# Patient Record
Sex: Female | Born: 1975
Health system: Southern US, Community
[De-identification: ages and names within clinical notes are randomized; demographics above are authoritative.]

## PROBLEM LIST (undated history)

## (undated) DIAGNOSIS — S92919A Unspecified fracture of unspecified toe(s), initial encounter for closed fracture: Secondary | ICD-10-CM

## (undated) DIAGNOSIS — S4290XA Fracture of unspecified shoulder girdle, part unspecified, initial encounter for closed fracture: Secondary | ICD-10-CM

## (undated) DIAGNOSIS — E7030 Albinism, unspecified: Secondary | ICD-10-CM

## (undated) DIAGNOSIS — Z973 Presence of spectacles and contact lenses: Secondary | ICD-10-CM

## (undated) DIAGNOSIS — J189 Pneumonia, unspecified organism: Secondary | ICD-10-CM

## (undated) DIAGNOSIS — H548 Legal blindness, as defined in USA: Secondary | ICD-10-CM

## (undated) DIAGNOSIS — K8020 Calculus of gallbladder without cholecystitis without obstruction: Secondary | ICD-10-CM

## (undated) DIAGNOSIS — K219 Gastro-esophageal reflux disease without esophagitis: Secondary | ICD-10-CM

## (undated) DIAGNOSIS — S62109A Fracture of unspecified carpal bone, unspecified wrist, initial encounter for closed fracture: Secondary | ICD-10-CM

## (undated) HISTORY — PX: KNEE ARTHROSCOPY: SHX127

---

## 1999-03-20 ENCOUNTER — Emergency Department (HOSPITAL_COMMUNITY): Admission: EM | Admit: 1999-03-20 | Discharge: 1999-03-20 | Payer: Self-pay | Admitting: Emergency Medicine

## 1999-10-01 ENCOUNTER — Emergency Department (HOSPITAL_COMMUNITY): Admission: EM | Admit: 1999-10-01 | Discharge: 1999-10-01 | Payer: Self-pay | Admitting: Emergency Medicine

## 2000-11-30 ENCOUNTER — Other Ambulatory Visit: Admission: RE | Admit: 2000-11-30 | Discharge: 2000-11-30 | Payer: Self-pay | Admitting: Obstetrics

## 2002-07-25 ENCOUNTER — Emergency Department (HOSPITAL_COMMUNITY): Admission: EM | Admit: 2002-07-25 | Discharge: 2002-07-25 | Payer: Self-pay | Admitting: *Deleted

## 2004-06-17 ENCOUNTER — Ambulatory Visit: Payer: Self-pay | Admitting: Family Medicine

## 2004-07-01 ENCOUNTER — Ambulatory Visit: Payer: Self-pay | Admitting: Internal Medicine

## 2004-08-16 ENCOUNTER — Ambulatory Visit: Payer: Self-pay | Admitting: *Deleted

## 2004-08-16 ENCOUNTER — Ambulatory Visit: Payer: Self-pay | Admitting: Internal Medicine

## 2004-08-23 ENCOUNTER — Ambulatory Visit: Payer: Self-pay | Admitting: Family Medicine

## 2004-09-09 ENCOUNTER — Ambulatory Visit: Payer: Self-pay | Admitting: Internal Medicine

## 2004-10-10 ENCOUNTER — Ambulatory Visit: Payer: Self-pay | Admitting: Internal Medicine

## 2004-11-04 ENCOUNTER — Ambulatory Visit: Payer: Self-pay | Admitting: Internal Medicine

## 2005-11-17 ENCOUNTER — Emergency Department (HOSPITAL_COMMUNITY): Admission: EM | Admit: 2005-11-17 | Discharge: 2005-11-17 | Payer: Self-pay | Admitting: Family Medicine

## 2005-11-28 ENCOUNTER — Emergency Department (HOSPITAL_COMMUNITY): Admission: EM | Admit: 2005-11-28 | Discharge: 2005-11-28 | Payer: Self-pay | Admitting: Emergency Medicine

## 2005-11-29 ENCOUNTER — Inpatient Hospital Stay (HOSPITAL_COMMUNITY): Admission: AD | Admit: 2005-11-29 | Discharge: 2005-11-29 | Payer: Self-pay | Admitting: Obstetrics and Gynecology

## 2005-12-10 ENCOUNTER — Emergency Department (HOSPITAL_COMMUNITY): Admission: EM | Admit: 2005-12-10 | Discharge: 2005-12-10 | Payer: Self-pay | Admitting: Family Medicine

## 2005-12-31 ENCOUNTER — Other Ambulatory Visit: Admission: RE | Admit: 2005-12-31 | Discharge: 2005-12-31 | Payer: Self-pay | Admitting: Obstetrics and Gynecology

## 2006-01-08 ENCOUNTER — Inpatient Hospital Stay (HOSPITAL_COMMUNITY): Admission: AD | Admit: 2006-01-08 | Discharge: 2006-01-08 | Payer: Self-pay | Admitting: Obstetrics and Gynecology

## 2006-02-16 ENCOUNTER — Ambulatory Visit (HOSPITAL_COMMUNITY): Admission: RE | Admit: 2006-02-16 | Discharge: 2006-02-16 | Payer: Self-pay | Admitting: Obstetrics and Gynecology

## 2006-06-02 ENCOUNTER — Inpatient Hospital Stay (HOSPITAL_COMMUNITY): Admission: AD | Admit: 2006-06-02 | Discharge: 2006-06-03 | Payer: Self-pay | Admitting: Obstetrics and Gynecology

## 2006-06-25 ENCOUNTER — Inpatient Hospital Stay (HOSPITAL_COMMUNITY): Admission: AD | Admit: 2006-06-25 | Discharge: 2006-06-25 | Payer: Self-pay | Admitting: Family Medicine

## 2006-07-04 ENCOUNTER — Inpatient Hospital Stay (HOSPITAL_COMMUNITY): Admission: AD | Admit: 2006-07-04 | Discharge: 2006-07-06 | Payer: Self-pay | Admitting: Obstetrics and Gynecology

## 2006-07-09 ENCOUNTER — Other Ambulatory Visit: Admission: RE | Admit: 2006-07-09 | Discharge: 2006-07-09 | Payer: Self-pay | Admitting: Obstetrics and Gynecology

## 2006-10-01 ENCOUNTER — Emergency Department (HOSPITAL_COMMUNITY): Admission: EM | Admit: 2006-10-01 | Discharge: 2006-10-01 | Payer: Self-pay | Admitting: Emergency Medicine

## 2007-11-22 ENCOUNTER — Emergency Department (HOSPITAL_COMMUNITY): Admission: EM | Admit: 2007-11-22 | Discharge: 2007-11-22 | Payer: Self-pay | Admitting: Family Medicine

## 2008-03-18 ENCOUNTER — Emergency Department (HOSPITAL_COMMUNITY): Admission: EM | Admit: 2008-03-18 | Discharge: 2008-03-18 | Payer: Self-pay | Admitting: Family Medicine

## 2008-05-24 ENCOUNTER — Inpatient Hospital Stay (HOSPITAL_COMMUNITY): Admission: AD | Admit: 2008-05-24 | Discharge: 2008-05-25 | Payer: Self-pay | Admitting: Obstetrics and Gynecology

## 2008-12-23 ENCOUNTER — Inpatient Hospital Stay (HOSPITAL_COMMUNITY): Admission: AD | Admit: 2008-12-23 | Discharge: 2008-12-25 | Payer: Self-pay | Admitting: Obstetrics and Gynecology

## 2009-11-06 ENCOUNTER — Emergency Department (HOSPITAL_COMMUNITY): Admission: EM | Admit: 2009-11-06 | Discharge: 2009-11-07 | Payer: Self-pay | Admitting: Emergency Medicine

## 2009-11-06 ENCOUNTER — Emergency Department (HOSPITAL_COMMUNITY): Admission: EM | Admit: 2009-11-06 | Discharge: 2009-11-06 | Payer: Self-pay | Admitting: Family Medicine

## 2010-07-02 ENCOUNTER — Emergency Department (HOSPITAL_COMMUNITY): Admission: EM | Admit: 2010-07-02 | Discharge: 2010-07-02 | Payer: Self-pay | Admitting: Family Medicine

## 2010-10-27 ENCOUNTER — Encounter: Payer: Self-pay | Admitting: Obstetrics and Gynecology

## 2010-12-25 LAB — URINALYSIS, ROUTINE W REFLEX MICROSCOPIC
Bilirubin Urine: NEGATIVE
Glucose, UA: NEGATIVE mg/dL
Hgb urine dipstick: NEGATIVE
Nitrite: NEGATIVE
Specific Gravity, Urine: 1.01 (ref 1.005–1.030)

## 2010-12-25 LAB — POCT URINALYSIS DIP (DEVICE)
Glucose, UA: NEGATIVE mg/dL
Specific Gravity, Urine: 1.01 (ref 1.005–1.030)

## 2010-12-25 LAB — COMPREHENSIVE METABOLIC PANEL
ALT: 31 U/L (ref 0–35)
AST: 19 U/L (ref 0–37)
Alkaline Phosphatase: 87 U/L (ref 39–117)
CO2: 23 mEq/L (ref 19–32)
Chloride: 106 mEq/L (ref 96–112)
Creatinine, Ser: 0.8 mg/dL (ref 0.4–1.2)
GFR calc Af Amer: >60 mL/min (ref >60)
GFR calc non Af Amer: >60 mL/min (ref >60)
Sodium: 138 mEq/L (ref 135–145)
Total Bilirubin: 0.6 mg/dL (ref 0.3–1.2)

## 2010-12-25 LAB — RPR: RPR Ser Ql: NONREACTIVE

## 2010-12-25 LAB — DIFFERENTIAL
Monocytes Absolute: 0.4 10*3/uL (ref 0.1–1.0)
Neutro Abs: 4.3 10*3/uL (ref 1.7–7.7)
Neutrophils Relative %: 55 % (ref 43–77)

## 2010-12-25 LAB — WET PREP, GENITAL
Trich, Wet Prep: NONE SEEN
Yeast Wet Prep HPF POC: NONE SEEN

## 2010-12-25 LAB — CBC
MCV: 91.3 fL (ref 78.0–100.0)
Platelets: 251 10*3/uL (ref 150–400)
RDW: 12.8 % (ref 11.5–15.5)
WBC: 7.8 10*3/uL (ref 4.0–10.5)

## 2011-01-01 ENCOUNTER — Inpatient Hospital Stay (INDEPENDENT_AMBULATORY_CARE_PROVIDER_SITE_OTHER)
Admission: RE | Admit: 2011-01-01 | Discharge: 2011-01-01 | Disposition: A | Discharge status: Home / Self Care | Payer: 59 | Source: Ambulatory Visit | Attending: Emergency Medicine | Admitting: Emergency Medicine

## 2011-01-01 DIAGNOSIS — M279 Disease of jaws, unspecified: Secondary | ICD-10-CM

## 2011-01-16 LAB — CBC
HCT: 40.6 % (ref 36.0–46.0)
Hemoglobin: 13.8 g/dL (ref 12.0–15.0)
MCV: 91.1 fL (ref 78.0–100.0)
Platelets: 204 10*3/uL (ref 150–400)
Platelets: 230 10*3/uL (ref 150–400)
RDW: 12.3 % (ref 11.5–15.5)
WBC: 9.3 10*3/uL (ref 4.0–10.5)

## 2011-01-16 LAB — RPR: RPR Ser Ql: NONREACTIVE
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
## 2011-02-21 NOTE — Op Note (Signed)
Dawn Patel, Dawn Patel              ACCOUNT NO.:  [id number]   MEDICAL RECORD NO.:  [id number]          PATIENT TYPE:  OUT   LOCATION:  ULT                           [facility]   PHYSICIAN:  Hal Morales, M.D.DATE OF BIRTH:  09-06-1976   DATE OF PROCEDURE:  02/16/2006  DATE OF DISCHARGE:                                 OPERATIVE REPORT   PREOPERATIVE DIAGNOSES:  1.  Intrauterine pregnancy at [redacted] weeks gestation.  2.  Abnormal maternal serum quadruple screen.  3.  Increased risk of Down syndrome.   POSTOPERATIVE DIAGNOSES:  1.  Intrauterine pregnancy at [redacted] weeks gestation.  2.  Abnormal maternal serum quadruple screen.  3.  Increased risk of Down syndrome.   PROCEDURE:  Genetic amniocentesis.   SURGEON:  Hal Morales, M.D.   ANESTHESIA:  Local.   ESTIMATED BLOOD LOSS:  Less than 5 cc.   COMPLICATIONS:  None.   FINDINGS:  The amniotic fluid volume was subjectively normal.  There was an  anterior placenta.  The pre-amniocentesis heart rate was 150 beats per  minute.  Patient's blood type is O positive.   DESCRIPTION OF PROCEDURE:  The patient was in the ultrasound suite in the  supine position, and sonography was used to document the fetal heart rate  and to locate a suitable amniotic fluid pocket.  After a full survey, the  area at the uterine fundus appeared to be without an area of thickened  placenta and without umbilical cord.  This area on the abdomen was just to  the left of and at the level of the umbilicus.  The area was cleansed with  Betadine and infiltrated with 1% Xylocaine for a total of 3 cc.   A 20-gauge Ultra-vue needle was then placed on the second attempt into the  amniotic fluid pocket, and the first 5 cc of amniotic fluid were drawn into  the initial syringe, the syringes changed, and another 20 cc of clear  amniotic fluid drawn into the second syringe.  The amniocentesis site was  documented on ultrasound, and the needle was removed.  The  post-  amniocentesis heart rate was 160 beats per minute.  The blood type was O  positive.  The patient tolerated the procedure well.   The amniotic fluid was sent to [facility] evaluation.  The parents declined FISH analysis and will await the full amniocentesis  results from the growth of amniotic cells.  The patient was given written  instructions for post-amniocentesis care from [facility],  Division of [facility].      Hal Morales, M.D.  Electronically Signed     VPH/MEDQ  D:  02/16/2006  T:  02/17/2006  Job:  [id number]

## 2011-06-27 LAB — POCT RAPID STREP A: Streptococcus, Group A Screen (Direct): NEGATIVE

## 2011-07-18 ENCOUNTER — Other Ambulatory Visit (HOSPITAL_COMMUNITY)
Admission: RE | Admit: 2011-07-18 | Discharge: 2011-07-18 | Disposition: A | Discharge status: Home / Self Care | Payer: 59 | Source: Ambulatory Visit | Attending: Family Medicine | Admitting: Family Medicine

## 2011-07-18 ENCOUNTER — Other Ambulatory Visit: Payer: Self-pay | Admitting: Physician Assistant

## 2011-07-18 DIAGNOSIS — Z124 Encounter for screening for malignant neoplasm of cervix: Secondary | ICD-10-CM | POA: Insufficient documentation

## 2012-08-12 ENCOUNTER — Other Ambulatory Visit (HOSPITAL_COMMUNITY)
Admission: RE | Admit: 2012-08-12 | Discharge: 2012-08-12 | Disposition: A | Discharge status: Home / Self Care | Payer: 59 | Source: Ambulatory Visit | Attending: Family Medicine | Admitting: Family Medicine

## 2012-08-12 ENCOUNTER — Other Ambulatory Visit: Payer: Self-pay | Admitting: Physician Assistant

## 2012-08-12 DIAGNOSIS — Z124 Encounter for screening for malignant neoplasm of cervix: Secondary | ICD-10-CM | POA: Insufficient documentation

## 2013-08-12 ENCOUNTER — Other Ambulatory Visit (HOSPITAL_COMMUNITY)
Admission: RE | Admit: 2013-08-12 | Discharge: 2013-08-12 | Disposition: A | Discharge status: Home / Self Care | Payer: Medicare FFS | Source: Ambulatory Visit | Attending: Family Medicine | Admitting: Family Medicine

## 2013-08-12 ENCOUNTER — Other Ambulatory Visit: Payer: Self-pay | Admitting: Physician Assistant

## 2013-08-12 DIAGNOSIS — Z124 Encounter for screening for malignant neoplasm of cervix: Secondary | ICD-10-CM | POA: Insufficient documentation

## 2014-01-19 ENCOUNTER — Other Ambulatory Visit (HOSPITAL_COMMUNITY)
Admission: RE | Admit: 2014-01-19 | Discharge: 2014-01-19 | Disposition: A | Discharge status: Home / Self Care | Payer: Medicare FFS | Source: Ambulatory Visit | Attending: Obstetrics & Gynecology | Admitting: Obstetrics & Gynecology

## 2014-01-19 ENCOUNTER — Other Ambulatory Visit: Payer: Self-pay | Admitting: Obstetrics & Gynecology

## 2014-01-19 DIAGNOSIS — Z1151 Encounter for screening for human papillomavirus (HPV): Secondary | ICD-10-CM | POA: Insufficient documentation

## 2014-01-19 DIAGNOSIS — Z124 Encounter for screening for malignant neoplasm of cervix: Secondary | ICD-10-CM | POA: Insufficient documentation

## 2014-10-13 ENCOUNTER — Ambulatory Visit
Admission: RE | Admit: 2014-10-13 | Discharge: 2014-10-13 | Disposition: A | Discharge status: Home / Self Care | Payer: Commercial Managed Care - HMO | Source: Ambulatory Visit | Attending: Family Medicine | Admitting: Family Medicine

## 2014-10-13 ENCOUNTER — Other Ambulatory Visit: Payer: Self-pay | Admitting: Family Medicine

## 2014-10-13 DIAGNOSIS — R053 Chronic cough: Secondary | ICD-10-CM

## 2014-10-13 DIAGNOSIS — J329 Chronic sinusitis, unspecified: Secondary | ICD-10-CM | POA: Diagnosis not present

## 2014-10-13 DIAGNOSIS — R05 Cough: Secondary | ICD-10-CM | POA: Diagnosis not present

## 2015-01-05 DIAGNOSIS — J329 Chronic sinusitis, unspecified: Secondary | ICD-10-CM | POA: Diagnosis not present

## 2015-04-03 ENCOUNTER — Encounter (HOSPITAL_COMMUNITY): Payer: Self-pay | Admitting: Emergency Medicine

## 2015-04-03 ENCOUNTER — Emergency Department (HOSPITAL_COMMUNITY)
Admission: EM | Admit: 2015-04-03 | Discharge: 2015-04-04 | Disposition: A | Discharge status: Home / Self Care | Payer: Commercial Managed Care - HMO | Attending: Emergency Medicine | Admitting: Emergency Medicine

## 2015-04-03 ENCOUNTER — Emergency Department (HOSPITAL_COMMUNITY): Payer: Commercial Managed Care - HMO

## 2015-04-03 DIAGNOSIS — R1010 Upper abdominal pain, unspecified: Secondary | ICD-10-CM | POA: Diagnosis not present

## 2015-04-03 DIAGNOSIS — K8070 Calculus of gallbladder and bile duct without cholecystitis without obstruction: Secondary | ICD-10-CM | POA: Insufficient documentation

## 2015-04-03 DIAGNOSIS — Z3202 Encounter for pregnancy test, result negative: Secondary | ICD-10-CM | POA: Insufficient documentation

## 2015-04-03 DIAGNOSIS — K8020 Calculus of gallbladder without cholecystitis without obstruction: Secondary | ICD-10-CM | POA: Diagnosis not present

## 2015-04-03 LAB — CBC WITH DIFFERENTIAL/PLATELET
BASOS ABS: 0 10*3/uL (ref 0.0–0.1)
Basophils Relative: 0 % (ref 0–1)
Eosinophils Absolute: 0.1 10*3/uL (ref 0.0–0.7)
Eosinophils Relative: 2 % (ref 0–5)
HEMATOCRIT: 38.6 % (ref 36.0–46.0)
HEMOGLOBIN: 13.3 g/dL (ref 12.0–15.0)
LYMPHS ABS: 3.4 10*3/uL (ref 0.7–4.0)
Lymphocytes Relative: 49 % — ABNORMAL HIGH (ref 12–46)
MCH: 29.3 pg (ref 26.0–34.0)
MCHC: 34.5 g/dL (ref 30.0–36.0)
MCV: 85 fL (ref 78.0–100.0)
MONO ABS: 0.4 10*3/uL (ref 0.1–1.0)
MONOS PCT: 6 % (ref 3–12)
Neutro Abs: 2.9 10*3/uL (ref 1.7–7.7)
Neutrophils Relative %: 43 % (ref 43–77)
Platelets: 247 10*3/uL (ref 150–400)
RBC: 4.54 MIL/uL (ref 3.87–5.11)
RDW: 12.1 % (ref 11.5–15.5)
WBC: 6.9 10*3/uL (ref 4.0–10.5)

## 2015-04-03 LAB — COMPREHENSIVE METABOLIC PANEL
ALBUMIN: 3.5 g/dL (ref 3.5–5.0)
ALK PHOS: 57 U/L (ref 38–126)
ALT: 14 U/L (ref 14–54)
ANION GAP: 6 (ref 5–15)
AST: 15 U/L (ref 15–41)
BILIRUBIN TOTAL: 0.6 mg/dL (ref 0.3–1.2)
BUN: 8 mg/dL (ref 6–20)
CHLORIDE: 105 mmol/L (ref 101–111)
CO2: 24 mmol/L (ref 22–32)
Calcium: 8.7 mg/dL — ABNORMAL LOW (ref 8.9–10.3)
Creatinine, Ser: 0.91 mg/dL (ref 0.44–1.00)
GFR calc non Af Amer: >60 mL/min (ref >60)
Glucose, Bld: 97 mg/dL (ref 65–99)
POTASSIUM: 3.4 mmol/L — AB (ref 3.5–5.1)
SODIUM: 135 mmol/L (ref 135–145)
TOTAL PROTEIN: 6.1 g/dL — AB (ref 6.5–8.1)

## 2015-04-03 LAB — URINE MICROSCOPIC-ADD ON

## 2015-04-03 LAB — URINALYSIS, ROUTINE W REFLEX MICROSCOPIC
Bilirubin Urine: NEGATIVE
Glucose, UA: NEGATIVE mg/dL
Ketones, ur: NEGATIVE mg/dL
Leukocytes, UA: NEGATIVE
NITRITE: NEGATIVE
PH: 5.5 (ref 5.0–8.0)
PROTEIN: NEGATIVE mg/dL
SPECIFIC GRAVITY, URINE: 1.018 (ref 1.005–1.030)
UROBILINOGEN UA: 0.2 mg/dL (ref 0.0–1.0)

## 2015-04-03 LAB — LIPASE, BLOOD: Lipase: 17 U/L — ABNORMAL LOW (ref 22–51)

## 2015-04-03 LAB — POC URINE PREG, ED: PREG TEST UR: NEGATIVE

## 2015-04-03 NOTE — ED Provider Notes (Signed)
CSN: [id number]     Arrival date & time 04/03/15  1922 History   First MD Initiated Contact with Patient 04/03/15 2248     Chief Complaint  Patient presents with  . Abdominal Pain     (Consider location/radiation/quality/duration/timing/severity/associated sxs/prior Treatment) HPI   39 year old female who presents for evaluation of abdominal pain. Patient reports for the past week she has had intermittent upper abdominal pain. She described pain as a sharp sensation across the right upper abdomen with occasional pain to her right flank. Pain is waxing waning sometimes worsening with eating. She endorse nausea and decreased appetite. She denies having vomiting or diarrhea. She did have some constipation for the last bowel movement was yesterday and was normal. She endorse chills. She denies having fever, headache, lightheadedness, dizziness, chest pain, shortness of breath, productive cough, dysuria, hematuria, vaginal bleeding, vaginal discharge, or rash. She denies any specific injury. No prior abdominal surgery. Her pain is currently 7 out of 10. No specific treatment tried except some Tylenol today which provide some improvement. She denies any history of alcohol abuse or history of diabetes.  History reviewed. No pertinent past medical history. Past Surgical History  Procedure Laterality Date  . Knee arthroscopy     No family history on file. History  Substance Use Topics  . Smoking status: Never Smoker   . Smokeless tobacco: Not on file  . Alcohol Use: No   OB History    No data available     Review of Systems  All other systems reviewed and are negative.     Allergies  Review of patient's allergies indicates no known allergies.  Home Medications   Prior to Admission medications   Medication Sig Start Date End Date Taking? Authorizing Provider  docusate sodium (COLACE) 100 MG capsule Take 100 mg by mouth daily as needed for mild constipation.   Yes Historical Provider,  MD   BP 120/50 mmHg  Pulse 56  Temp(Src) 98.3 F (36.8 C) (Oral)  Resp 20  SpO2 99%  LMP 04/02/2015 Physical Exam  Constitutional: She appears well-developed and well-nourished. No distress.  HENT:  Head: Atraumatic.  Eyes: Conjunctivae are normal.  Neck: Neck supple.  Cardiovascular: Normal rate and regular rhythm.   Pulmonary/Chest: Effort normal and breath sounds normal. No respiratory distress. She has no wheezes.  Abdominal: Soft. Bowel sounds are normal. She exhibits no distension. There is tenderness (mild right side anterior abdominal discomfort on palpation without guarding or rebound tenderness. Negative Murphy sign. No pain at McBurney's point.).  Genitourinary:  Mild right side CVA tenderness  Neurological: She is alert.  Skin: No rash noted.  Psychiatric: She has a normal mood and affect.  Nursing note and vitals reviewed.   ED Course  Procedures (including critical care time)  Patient here with right-sided abdominal pain that radiates to her back worsening with eating and she has decreased appetite. She does not have any significant discomfort on exam. Plan to obtain abdominal and pelvis ultrasound to rule out gallbladder etiology given her complaint. She is afebrile with stable normal vital sign. She does not have a distended abdomen therefore I have low suspicion for bowel perforation or SBO at this time.    11:41 PM Normal WBC, no leukocytosis, no signs of anemia. Electrolytes panel is unremarkable. Pregnancy test is negative. Urine shows large hemoglobin however patient is currently on her menstruation. Low suspicion for kidney stone.  12:24 AM Ultrasound of the abdomen demonstrate evidence of cholelithiasis with thickening gallbladder wall  but no pericholecystic fluid. It is recommended that a hepatobiliary scintigraphy back of the provide better evaluation for acute cholecystitis. However at this time patient is resting comfortably, no nausea or vomiting and  pain is well controlled therefore, patient is amenable to follow-up with general surgery for further evaluation and management of her gallstones. Patient made aware to return if her condition worsened. She is to avoid fatty food at this time. Pain medication and antinausea medication prescribed.    Labs Review Labs Reviewed  CBC WITH DIFFERENTIAL/PLATELET - Abnormal; Notable for the following:    Lymphocytes Relative 49 (*)    All other components within normal limits  COMPREHENSIVE METABOLIC PANEL - Abnormal; Notable for the following:    Potassium 3.4 (*)    Calcium 8.7 (*)    Total Protein 6.1 (*)    All other components within normal limits  URINALYSIS, ROUTINE W REFLEX MICROSCOPIC (NOT AT [facility]) - Abnormal; Notable for the following:    Hgb urine dipstick LARGE (*)    All other components within normal limits  LIPASE, BLOOD - Abnormal; Notable for the following:    Lipase 17 (*)    All other components within normal limits  URINE MICROSCOPIC-ADD ON - Abnormal; Notable for the following:    Squamous Epithelial / LPF FEW (*)    Bacteria, UA FEW (*)    All other components within normal limits  POC URINE PREG, ED    Imaging Review US Abdomen Complete  04/04/2015   CLINICAL DATA:  39 year old female with upper abdominal pain.  EXAM: ULTRASOUND ABDOMEN COMPLETE  COMPARISON:  None  FINDINGS: Gallbladder: The gallbladder is partially contracted. A 9 mm mobile stone is noted within the gallbladder neck. The gallbladder wall is thickened and measures up to 7 mm which may be partly related to partial contraction. No pericholecystic fluid noted. Tenderness was elicited over the gallbladder area during scanning.  Common bile duct: Diameter: 5 mm  Liver: No focal lesion identified. Within normal limits in parenchymal echogenicity.  IVC: No abnormality visualized.  Pancreas: Visualized portion unremarkable.  Spleen: Size and appearance within normal limits.  Right Kidney: Length: 12 cm.  Echogenicity within normal limits. No mass or hydronephrosis visualized.  Left Kidney: Length: 11 cm. Echogenicity within normal limits. No mass or hydronephrosis visualized.  Abdominal aorta: No aneurysm visualized.  Other findings: None.  IMPRESSION: Cholelithiasis with thickened gallbladder wall. No pericholecystic fluid. A hepatobiliary scintigraphy may provide better evaluation of the gallbladder if an acute cholecystitis is clinically suspected.   Electronically Signed   By: Anner Crete M.D.   On: 04/04/2015 00:13     EKG Interpretation None      MDM   Final diagnoses:  Upper abdominal pain  Calculus of gallbladder and bile duct without cholecystitis or obstruction    BP 118/58 mmHg  Pulse 60  Temp(Src) 98.3 F (36.8 C) (Oral)  Resp 20  SpO2 96%  LMP 04/02/2015  I have reviewed nursing notes and vital signs. I personally viewed the imaging tests through PACS system and agrees with radiologist's intepretation I reviewed available ER/hospitalization records through the EMR     Domenic Moras, PA-C 66/06/30 1601  Delora Fuel, MD 09/32/35 [id number]

## 2015-04-03 NOTE — ED Notes (Signed)
Pt c/o right upper and right lower abd pain, onset 1 week ago.  Has had nausea without vomiting.  St's she also has been constipated

## 2015-04-04 MED ORDER — HYDROCODONE-ACETAMINOPHEN 5-325 MG PO TABS: 1.0000 | ORAL_TABLET | Freq: Four times a day (QID) | ORAL | Status: DC | PRN

## 2015-04-04 MED ORDER — ONDANSETRON HCL 4 MG PO TABS: 4.0000 mg | ORAL_TABLET | Freq: Four times a day (QID) | ORAL | Status: DC

## 2015-04-04 NOTE — Discharge Instructions (Signed)
You have gallstone causing your abdominal pain. Please follow-up closely with general surgeon for outpatient management. Avoid food with fatty content. Take pain medication and antinausea medication as prescribed. Return to ER if the condition worsen or if you have any other concern.  Cholelithiasis Cholelithiasis (also called gallstones) is a form of gallbladder disease in which gallstones form in your gallbladder. The gallbladder is an organ that stores bile made in the liver, which helps digest fats. Gallstones begin as small crystals and slowly grow into stones. Gallstone pain occurs when the gallbladder spasms and a gallstone is blocking the duct. Pain can also occur when a stone passes out of the duct.  RISK FACTORS  Being female.   Having multiple pregnancies. Health care providers sometimes advise removing diseased gallbladders before future pregnancies.   Being obese.  Eating a diet heavy in fried foods and fat.   Being older than 31 years and increasing age.   Prolonged use of medicines containing female hormones.   Having diabetes mellitus.   Rapidly losing weight.   Having a family history of gallstones (heredity).  SYMPTOMS  Nausea.   Vomiting.  Abdominal pain.   Yellowing of the skin (jaundice).   Sudden pain. It may persist from several minutes to several hours.  Fever.   Tenderness to the touch. In some cases, when gallstones do not move into the bile duct, people have no pain or symptoms. These are called "silent" gallstones.  TREATMENT Silent gallstones do not need treatment. In severe cases, emergency surgery may be required. Options for treatment include:  Surgery to remove the gallbladder. This is the most common treatment.  Medicines. These do not always work and may take 6-12 months or more to work.  Shock wave treatment (extracorporeal biliary lithotripsy). In this treatment an ultrasound machine sends shock waves to the gallbladder  to break gallstones into smaller pieces that can pass into the intestines or be dissolved by medicine. HOME CARE INSTRUCTIONS   Only take over-the-counter or prescription medicines for pain, discomfort, or fever as directed by your health care provider.   Follow a low-fat diet until seen again by your health care provider. Fat causes the gallbladder to contract, which can result in pain.   Follow up with your health care provider as directed. Attacks are almost always recurrent and surgery is usually required for permanent treatment.  SEEK IMMEDIATE MEDICAL CARE IF:   Your pain increases and is not controlled by medicines.   You have a fever or persistent symptoms for more than 2-3 days.   You have a fever and your symptoms suddenly get worse.   You have persistent nausea and vomiting.  MAKE SURE YOU:   Understand these instructions.  Will watch your condition.  Will get help right away if you are not doing well or get worse. Document Released: 09/18/2005 Document Revised: 05/25/2013 Document Reviewed: 03/16/2013 [facility] Patient Information 2015 [place], [place]. This information is not intended to replace advice given to you by your health care provider. Make sure you discuss any questions you have with your health care provider.

## 2015-04-04 NOTE — ED Notes (Signed)
Pt. Left with all belongings and refused wheelchair 

## 2015-04-25 ENCOUNTER — Ambulatory Visit: Payer: Self-pay | Admitting: Surgery

## 2015-04-25 DIAGNOSIS — K8010 Calculus of gallbladder with chronic cholecystitis without obstruction: Secondary | ICD-10-CM | POA: Diagnosis not present

## 2015-05-09 ENCOUNTER — Encounter (HOSPITAL_COMMUNITY): Payer: Self-pay

## 2015-05-09 ENCOUNTER — Encounter (HOSPITAL_COMMUNITY)
Admission: RE | Admit: 2015-05-09 | Discharge: 2015-05-09 | Disposition: A | Discharge status: Home / Self Care | Payer: Commercial Managed Care - HMO | Source: Ambulatory Visit | Attending: Surgery | Admitting: Surgery

## 2015-05-09 DIAGNOSIS — K219 Gastro-esophageal reflux disease without esophagitis: Secondary | ICD-10-CM | POA: Diagnosis not present

## 2015-05-09 DIAGNOSIS — K8010 Calculus of gallbladder with chronic cholecystitis without obstruction: Secondary | ICD-10-CM | POA: Diagnosis not present

## 2015-05-09 HISTORY — DX: Calculus of gallbladder without cholecystitis without obstruction: K80.20

## 2015-05-09 HISTORY — DX: Unspecified fracture of unspecified toe(s), initial encounter for closed fracture: S92.919A

## 2015-05-09 HISTORY — DX: Gastro-esophageal reflux disease without esophagitis: K21.9

## 2015-05-09 HISTORY — DX: Fracture of unspecified shoulder girdle, part unspecified, initial encounter for closed fracture: S42.90XA

## 2015-05-09 HISTORY — DX: Presence of spectacles and contact lenses: Z97.3

## 2015-05-09 HISTORY — DX: Pneumonia, unspecified organism: J18.9

## 2015-05-09 LAB — BASIC METABOLIC PANEL
Anion gap: 6 (ref 5–15)
BUN: 7 mg/dL (ref 6–20)
CO2: 27 mmol/L (ref 22–32)
Calcium: 9.4 mg/dL (ref 8.9–10.3)
Chloride: 105 mmol/L (ref 101–111)
Creatinine, Ser: 0.97 mg/dL (ref 0.44–1.00)
GFR calc non Af Amer: >60 mL/min (ref >60)
Glucose, Bld: 78 mg/dL (ref 65–99)
Potassium: 4 mmol/L (ref 3.5–5.1)
Sodium: 138 mmol/L (ref 135–145)

## 2015-05-09 LAB — CBC
HCT: 38.8 % (ref 36.0–46.0)
HEMOGLOBIN: 13.2 g/dL (ref 12.0–15.0)
MCH: 29.6 pg (ref 26.0–34.0)
MCHC: 34 g/dL (ref 30.0–36.0)
MCV: 87 fL (ref 78.0–100.0)
PLATELETS: 241 10*3/uL (ref 150–400)
RBC: 4.46 MIL/uL (ref 3.87–5.11)
RDW: 12.3 % (ref 11.5–15.5)
WBC: 5 10*3/uL (ref 4.0–10.5)

## 2015-05-09 LAB — HCG, SERUM, QUALITATIVE: Preg, Serum: NEGATIVE

## 2015-05-09 NOTE — Pre-Procedure Instructions (Signed)
GREER WAINRIGHT  05/09/2015      [facility] [place], [place] - [address] [place] [address] Phone: [phone] Fax: [phone]    Your procedure is scheduled on Friday, May 11, 2015.  Report to [facility] Admitting at 8:00 A.M.  Call this number if you have problems the morning of surgery:  [phone]   Remember:  Do not eat food or drink liquids after midnight Thursday, May 10, 2015  Take these medicines the morning of surgery with A SIP OF WATER : ranitidine (ZANTAC),  if needed: HYDROcodone-acetaminophen (NORCO/VICODIN) for pain,  fluticasone (FLONASE) 50 MCG/ACT nasal spray for allergies or stuffy nose Stop taking Aspirin, vitamins and herbal medications. Do not take any NSAIDs ie: Ibuprofen, Advil, Naproxen or any medication containing Aspirin; stop now  Do not wear jewelry, make-up or nail polish.  Do not wear lotions, powders, or perfumes.  You may not wear deodorant.  Do not shave 48 hours prior to surgery.    Do not bring valuables to the hospital.  [facility] is not responsible for any belongings or valuables.  Contacts, dentures or bridgework may not be worn into surgery.  Leave your suitcase in the car.  After surgery it may be brought to your room.  For patients admitted to the hospital, discharge time will be determined by your treatment team.  Patients discharged the day of surgery will not be allowed to drive home.   Name and phone number of your driver:   Special instructions: Special Instructions:Special Instructions: [facility] - Preparing for Surgery  Before surgery, you can play an important role.  Because skin is not sterile, your skin needs to be as free of germs as possible.  You can reduce the number of germs on you skin by washing with CHG (chlorahexidine gluconate) soap before surgery.  CHG is an antiseptic cleaner which kills germs and bonds with the skin to continue killing  germs even after washing.  Please DO NOT use if you have an allergy to CHG or antibacterial soaps.  If your skin becomes reddened/irritated stop using the CHG and inform your nurse when you arrive at Short Stay.  Do not shave (including legs and underarms) for at least 48 hours prior to the first CHG shower.  You may shave your face.  Please follow these instructions carefully:   1.  Shower with CHG Soap the night before surgery and the morning of Surgery.  2.  If you choose to wash your hair, wash your hair first as usual with your normal shampoo.  3.  After you shampoo, rinse your hair and body thoroughly to remove the Shampoo.  4.  Use CHG as you would any other liquid soap.  You can apply chg directly  to the skin and wash gently with scrungie or a clean washcloth.  5.  Apply the CHG Soap to your body ONLY FROM THE NECK DOWN.  Do not use on open wounds or open sores.  Avoid contact with your eyes, ears, mouth and genitals (private parts).  Wash genitals (private parts) with your normal soap.  6.  Wash thoroughly, paying special attention to the area where your surgery will be performed.  7.  Thoroughly rinse your body with warm water from the neck down.  8.  DO NOT shower/wash with your normal soap after using and rinsing off the CHG Soap.  9.  Pat yourself dry with a  clean towel.            10.  Wear clean pajamas.            11.  Place clean sheets on your bed the night of your first shower and do not sleep with pets.  Day of Surgery  Do not apply any lotions/deodorants the morning of surgery.  Please wear clean clothes to the hospital/surgery center.  Please read over the following fact sheets that you were given. Pain Booklet, Coughing and Deep Breathing and Surgical Site Infection Prevention

## 2015-05-10 ENCOUNTER — Encounter (HOSPITAL_COMMUNITY): Payer: Self-pay | Admitting: Surgery

## 2015-05-10 DIAGNOSIS — K8010 Calculus of gallbladder with chronic cholecystitis without obstruction: Secondary | ICD-10-CM | POA: Diagnosis present

## 2015-05-10 MED ORDER — DEXTROSE 5 % IV SOLN
3.0000 g | INTRAVENOUS | Status: AC
  Administered 2015-05-11: 3 g via INTRAVENOUS
  Filled 2015-05-10: qty 3000

## 2015-05-10 NOTE — H&P (Signed)
General Surgery [facility]  Dawn Patel DOB: 1975/11/09 Single / Language: Cleophus Molt / Race: Black or African American Female  History of Present Illness Patient words: gallbladder.  The patient is a 39 year old female who presents for evaluation of gall stones. Patient referred from the emergency department, Dr. Delora Fuel, for treatment of symptomatic cholelithiasis. Patient had presented to the emergency department on April 04, 2015 with abdominal pain radiating to the right flank following an evening meal. Evaluation included laboratory studies which were normal. Ultrasound was performed which showed a 9 mm gallstone lodged in the gallbladder neck. Gallbladder wall was thickened to 7 mm. There was no pericholecystic fluid present. Patient was discharged home with pain medication. She was referred to general surgery by her primary care physician, Dr. Serita Grammes. Patient had been having intermittent mild right upper quadrant abdominal pain for approximately one year. She denies nausea or vomiting. She has had chills but no fever. She denies jaundice or acholic stools. She has had no prior history of a hepatic or pancreatic disease. She has had no prior abdominal surgery. There is a family history of cholecystectomy and the patient's brother and multiple aunts.   Other Problems Back Pain Cholelithiasis Gastroesophageal Reflux Disease Other disease, cancer, significant illness  Past Surgical History Anal Fissure Repair Knee Surgery Left.  Diagnostic Studies History Colonoscopy never Mammogram never Pap Smear 1-5 years ago  Allergies  No Known Drug Allergies07/20/2016  Medication History  Hydrocodone-Acetaminophen (5-325MG  Tablet, Oral) Active. Ondansetron HCl (4MG  Tablet, Oral) Active. Colace (100MG  Capsule, Oral) Active. Medications Reconciled  Social History  Caffeine use Carbonated beverages, Coffee, Tea. No alcohol  use No drug use Tobacco use Never smoker.  Family History Alcohol Abuse Father. Arthritis Mother. Diabetes Mellitus Mother. Hypertension Father, Mother. Migraine Headache Brother, Mother.  Pregnancy / Birth History Age at menarche 24 years. Gravida 2 Maternal age 77-35 Para 2 Regular periods  Review of Systems General Present- Appetite Loss, Chills, Fatigue and Weight Loss. Not Present- Fever, Night Sweats and Weight Gain. Skin Not Present- Change in Wart/Mole, Dryness, Hives, Jaundice, New Lesions, Non-Healing Wounds, Rash and Ulcer. HEENT Present- Seasonal Allergies, Sinus Pain and Wears glasses/contact lenses. Not Present- Earache, Hearing Loss, Hoarseness, Nose Bleed, Oral Ulcers, Ringing in the Ears, Sore Throat, Visual Disturbances and Yellow Eyes. Respiratory Not Present- Bloody sputum, Chronic Cough, Difficulty Breathing, Snoring and Wheezing. Breast Not Present- Breast Mass, Breast Pain, Nipple Discharge and Skin Changes. Cardiovascular Not Present- Chest Pain, Difficulty Breathing Lying Down, Leg Cramps, Palpitations, Rapid Heart Rate, Shortness of Breath and Swelling of Extremities. Gastrointestinal Present- Abdominal Pain, Bloating, Change in Bowel Habits, Constipation, Gets full quickly at meals, Indigestion and Rectal Pain. Not Present- Bloody Stool, Chronic diarrhea, Difficulty Swallowing, Excessive gas, Hemorrhoids, Nausea and Vomiting. Female Genitourinary Not Present- Frequency, Nocturia, Painful Urination, Pelvic Pain and Urgency. Musculoskeletal Present- Back Pain and Swelling of Extremities. Not Present- Joint Pain, Joint Stiffness, Muscle Pain and Muscle Weakness. Neurological Not Present- Decreased Memory, Fainting, Headaches, Numbness, Seizures, Tingling, Tremor, Trouble walking and Weakness. Psychiatric Present- Change in Sleep Pattern. Not Present- Anxiety, Bipolar, Depression, Fearful and Frequent crying. Endocrine Not Present- Cold Intolerance,  Excessive Hunger, Hair Changes, Heat Intolerance, Hot flashes and New Diabetes. Hematology Not Present- Easy Bruising, Excessive bleeding, Gland problems, HIV and Persistent Infections.   Vitals Weight: 236 lb Height: 66in Body Surface Area: 2.23 m Body Mass Index: 38.09 kg/m Temp.: 98.71F(Oral)  Pulse: 62 (Regular)  BP: 136/78 (Sitting, Left Arm, Standard)  Physical Exam  General - appears comfortable, no distress; not diaphorectic  HEENT - normocephalic; sclerae clear, gaze dysconjugate (patient legally blind); mucous membranes moist, dentition good; voice normal  Neck - symmetric on extension; no palpable anterior or posterior cervical adenopathy; no palpable masses in the thyroid bed  Chest - clear bilaterally without rhonchi, rales, or wheeze  Cor - regular rhythm with normal rate; no significant murmur  Abd - soft without distension; mild tenderness right upper quadrant to deep palpation; no palpable mass; no sign of hernia; no costovertebral angle tenderness  Ext - non-tender without significant edema or lymphedema  Neuro - grossly intact; no tremor    Assessment & Plan  CHRONIC CHOLECYSTITIS WITH CALCULUS (574.10  K80.10)  Patient presents with symptomatic cholelithiasis. There is no sign of acute cholecystitis. Patient is provided with written literature to review at home.  Patient has symptomatic cholelithiasis and is a good candidate for laparoscopic cholecystectomy. We have discussed the procedure. We have discussed the hospital stay to be anticipated. Patient understands and wishes to proceed with surgery in the near future.  The risks and benefits of the procedure have been discussed at length with the patient. The patient understands the proposed procedure, potential alternative treatments, and the course of recovery to be expected. All of the patient's questions have been answered at this time. The patient wishes to proceed with  surgery.  Earnstine Regal, MD, [facility] Office: [phone]

## 2015-05-11 ENCOUNTER — Observation Stay (HOSPITAL_COMMUNITY)
Admission: RE | Admit: 2015-05-11 | Discharge: 2015-05-12 | Disposition: A | Discharge status: Home / Self Care | Payer: Commercial Managed Care - HMO | Source: Ambulatory Visit | Attending: Surgery | Admitting: Surgery

## 2015-05-11 ENCOUNTER — Ambulatory Visit (HOSPITAL_COMMUNITY): Payer: Commercial Managed Care - HMO | Admitting: Anesthesiology

## 2015-05-11 ENCOUNTER — Encounter (HOSPITAL_COMMUNITY): Payer: Self-pay | Admitting: *Deleted

## 2015-05-11 ENCOUNTER — Observation Stay (HOSPITAL_COMMUNITY): Payer: Commercial Managed Care - HMO

## 2015-05-11 ENCOUNTER — Encounter (HOSPITAL_COMMUNITY)
Admission: RE | Disposition: A | Discharge status: Home / Self Care | Payer: Self-pay | Source: Ambulatory Visit | Attending: Surgery

## 2015-05-11 DIAGNOSIS — K8020 Calculus of gallbladder without cholecystitis without obstruction: Secondary | ICD-10-CM

## 2015-05-11 DIAGNOSIS — K219 Gastro-esophageal reflux disease without esophagitis: Secondary | ICD-10-CM | POA: Diagnosis not present

## 2015-05-11 DIAGNOSIS — Z9889 Other specified postprocedural states: Secondary | ICD-10-CM | POA: Diagnosis not present

## 2015-05-11 DIAGNOSIS — K819 Cholecystitis, unspecified: Secondary | ICD-10-CM | POA: Diagnosis not present

## 2015-05-11 DIAGNOSIS — K8010 Calculus of gallbladder with chronic cholecystitis without obstruction: Principal | ICD-10-CM | POA: Diagnosis present

## 2015-05-11 HISTORY — PX: CHOLECYSTECTOMY: SHX55

## 2015-05-11 HISTORY — DX: Fracture of unspecified carpal bone, unspecified wrist, initial encounter for closed fracture: S62.109A

## 2015-05-11 SURGERY — LAPAROSCOPIC CHOLECYSTECTOMY WITH INTRAOPERATIVE CHOLANGIOGRAM
Anesthesia: General

## 2015-05-11 MED ORDER — ONDANSETRON 4 MG PO TBDP
4.0000 mg | ORAL_TABLET | Freq: Four times a day (QID) | ORAL | Status: DC | PRN
  Filled 2015-05-11: qty 1

## 2015-05-11 MED ORDER — BUPIVACAINE-EPINEPHRINE 0.25% -1:200000 IJ SOLN
INTRAMUSCULAR | Status: DC | PRN
  Administered 2015-05-11: 20 mL

## 2015-05-11 MED ORDER — GLYCOPYRROLATE 0.2 MG/ML IJ SOLN
INTRAMUSCULAR | Status: DC | PRN
  Administered 2015-05-11: 0.6 mg via INTRAVENOUS

## 2015-05-11 MED ORDER — ONDANSETRON HCL 4 MG/2ML IJ SOLN
INTRAMUSCULAR | Status: DC | PRN
  Administered 2015-05-11: 4 mg via INTRAVENOUS

## 2015-05-11 MED ORDER — MIDAZOLAM HCL 5 MG/5ML IJ SOLN
INTRAMUSCULAR | Status: DC | PRN
  Administered 2015-05-11: 2 mg via INTRAVENOUS

## 2015-05-11 MED ORDER — LIDOCAINE HCL (CARDIAC) 20 MG/ML IV SOLN
INTRAVENOUS | Status: AC
  Filled 2015-05-11: qty 5

## 2015-05-11 MED ORDER — LACTATED RINGERS IV SOLN
INTRAVENOUS | Status: DC
  Administered 2015-05-11 (×3): via INTRAVENOUS

## 2015-05-11 MED ORDER — ACETAMINOPHEN 325 MG PO TABS: 650.0000 mg | ORAL_TABLET | Freq: Four times a day (QID) | ORAL | Status: DC | PRN

## 2015-05-11 MED ORDER — 0.9 % SODIUM CHLORIDE (POUR BTL) OPTIME
TOPICAL | Status: DC | PRN
  Administered 2015-05-11: 1000 mL

## 2015-05-11 MED ORDER — ROCURONIUM BROMIDE 100 MG/10ML IV SOLN
INTRAVENOUS | Status: DC | PRN
  Administered 2015-05-11: 50 mg via INTRAVENOUS

## 2015-05-11 MED ORDER — HYDROMORPHONE HCL 1 MG/ML IJ SOLN
0.2500 mg | INTRAMUSCULAR | Status: DC | PRN
  Administered 2015-05-11 (×2): 0.5 mg via INTRAVENOUS

## 2015-05-11 MED ORDER — BUPIVACAINE-EPINEPHRINE (PF) 0.25% -1:200000 IJ SOLN
INTRAMUSCULAR | Status: AC
  Filled 2015-05-11: qty 30

## 2015-05-11 MED ORDER — PROPOFOL 10 MG/ML IV BOLUS
INTRAVENOUS | Status: DC | PRN
  Administered 2015-05-11: 150 mg via INTRAVENOUS

## 2015-05-11 MED ORDER — HYDROCODONE-ACETAMINOPHEN 5-325 MG PO TABS: 1.0000 | ORAL_TABLET | ORAL | Status: DC | PRN

## 2015-05-11 MED ORDER — LIDOCAINE HCL (CARDIAC) 20 MG/ML IV SOLN
INTRAVENOUS | Status: DC | PRN
  Administered 2015-05-11: 60 mg via INTRAVENOUS
  Administered 2015-05-11: 100 mg via INTRATRACHEAL

## 2015-05-11 MED ORDER — FENTANYL CITRATE (PF) 250 MCG/5ML IJ SOLN
INTRAMUSCULAR | Status: AC
  Filled 2015-05-11: qty 5

## 2015-05-11 MED ORDER — FLUTICASONE PROPIONATE 50 MCG/ACT NA SUSP
2.0000 | Freq: Every day | NASAL | Status: DC | PRN
  Filled 2015-05-11: qty 16

## 2015-05-11 MED ORDER — KCL IN DEXTROSE-NACL 20-5-0.45 MEQ/L-%-% IV SOLN
INTRAVENOUS | Status: DC
  Administered 2015-05-11 – 2015-05-12 (×2): via INTRAVENOUS
  Filled 2015-05-11 (×2): qty 1000

## 2015-05-11 MED ORDER — HYDROMORPHONE HCL 1 MG/ML IJ SOLN
1.0000 mg | INTRAMUSCULAR | Status: DC | PRN
  Administered 2015-05-11 – 2015-05-12 (×4): 1 mg via INTRAVENOUS
  Filled 2015-05-11 (×4): qty 1

## 2015-05-11 MED ORDER — PROPOFOL 10 MG/ML IV BOLUS
INTRAVENOUS | Status: AC
  Filled 2015-05-11: qty 20

## 2015-05-11 MED ORDER — MIDAZOLAM HCL 2 MG/2ML IJ SOLN
INTRAMUSCULAR | Status: AC
  Filled 2015-05-11: qty 4

## 2015-05-11 MED ORDER — NEOSTIGMINE METHYLSULFATE 10 MG/10ML IV SOLN
INTRAVENOUS | Status: AC
  Filled 2015-05-11: qty 1

## 2015-05-11 MED ORDER — ONDANSETRON HCL 4 MG/2ML IJ SOLN: 4.0000 mg | Freq: Four times a day (QID) | INTRAMUSCULAR | Status: DC | PRN

## 2015-05-11 MED ORDER — NEOSTIGMINE METHYLSULFATE 10 MG/10ML IV SOLN
INTRAVENOUS | Status: DC | PRN
  Administered 2015-05-11: 5 mg via INTRAVENOUS

## 2015-05-11 MED ORDER — SODIUM CHLORIDE 0.9 % IR SOLN
Status: DC | PRN
  Administered 2015-05-11: 1000 mL

## 2015-05-11 MED ORDER — HYDROMORPHONE HCL 1 MG/ML IJ SOLN
INTRAMUSCULAR | Status: AC
  Filled 2015-05-11: qty 1

## 2015-05-11 MED ORDER — DEXAMETHASONE SODIUM PHOSPHATE 4 MG/ML IJ SOLN
INTRAMUSCULAR | Status: DC | PRN
  Administered 2015-05-11: 8 mg via INTRAVENOUS

## 2015-05-11 MED ORDER — ACETAMINOPHEN 650 MG RE SUPP: 650.0000 mg | Freq: Four times a day (QID) | RECTAL | Status: DC | PRN

## 2015-05-11 MED ORDER — SODIUM CHLORIDE 0.9 % IV SOLN
INTRAVENOUS | Status: DC | PRN
  Administered 2015-05-11: 15 mL

## 2015-05-11 MED ORDER — FENTANYL CITRATE (PF) 100 MCG/2ML IJ SOLN
INTRAMUSCULAR | Status: DC | PRN
  Administered 2015-05-11: 50 ug via INTRAVENOUS
  Administered 2015-05-11: 150 ug via INTRAVENOUS
  Administered 2015-05-11 (×2): 50 ug via INTRAVENOUS

## 2015-05-11 MED ORDER — GLYCOPYRROLATE 0.2 MG/ML IJ SOLN
INTRAMUSCULAR | Status: AC
  Filled 2015-05-11: qty 3

## 2015-05-11 SURGICAL SUPPLY — 48 items
APL SKNCLS STERI-STRIP NONHPOA (GAUZE/BANDAGES/DRESSINGS) ×1
APPLIER CLIP ROT 10 11.4 M/L (STAPLE) ×2
APR CLP MED LRG 11.4X10 (STAPLE) ×1
BAG SPEC RTRVL LRG 6X4 10 (ENDOMECHANICALS) ×1
BENZOIN TINCTURE PRP APPL 2/3 (GAUZE/BANDAGES/DRESSINGS) ×2 IMPLANT
BLADE SURG CLIPPER 3M 9600 (MISCELLANEOUS) IMPLANT
CANISTER SUCTION 2500CC (MISCELLANEOUS) ×2 IMPLANT
CHLORAPREP W/TINT 26ML (MISCELLANEOUS) ×2 IMPLANT
CLIP APPLIE ROT 10 11.4 M/L (STAPLE) ×1 IMPLANT
CLSR STERI-STRIP ANTIMIC 1/2X4 (GAUZE/BANDAGES/DRESSINGS) ×2 IMPLANT
COVER MAYO STAND STRL (DRAPES) ×2 IMPLANT
COVER SURGICAL LIGHT HANDLE (MISCELLANEOUS) ×2 IMPLANT
DRAPE C-ARM 42X72 X-RAY (DRAPES) ×2 IMPLANT
ELECT REM PT RETURN 9FT ADLT (ELECTROSURGICAL) ×2
ELECTRODE REM PT RTRN 9FT ADLT (ELECTROSURGICAL) ×1 IMPLANT
GAUZE SPONGE 2X2 8PLY STRL LF (GAUZE/BANDAGES/DRESSINGS) ×1 IMPLANT
GLOVE BIO SURGEON STRL SZ7.5 (GLOVE) ×4 IMPLANT
GLOVE BIOGEL PI IND STRL 7.0 (GLOVE) IMPLANT
GLOVE BIOGEL PI IND STRL 7.5 (GLOVE) IMPLANT
GLOVE BIOGEL PI INDICATOR 7.0 (GLOVE) ×1
GLOVE BIOGEL PI INDICATOR 7.5 (GLOVE) ×1
GLOVE BIOGEL PI ORTHO PRO SZ7 (GLOVE) ×1
GLOVE PI ORTHO PRO STRL SZ7 (GLOVE) ×1 IMPLANT
GLOVE SURG ORTHO 8.0 STRL STRW (GLOVE) ×2 IMPLANT
GOWN STRL REUS W/ TWL LRG LVL3 (GOWN DISPOSABLE) ×2 IMPLANT
GOWN STRL REUS W/ TWL XL LVL3 (GOWN DISPOSABLE) ×1 IMPLANT
GOWN STRL REUS W/TWL LRG LVL3 (GOWN DISPOSABLE) ×4
GOWN STRL REUS W/TWL XL LVL3 (GOWN DISPOSABLE) ×2
KIT BASIN OR (CUSTOM PROCEDURE TRAY) ×2 IMPLANT
KIT ROOM TURNOVER OR (KITS) ×2 IMPLANT
NS IRRIG 1000ML POUR BTL (IV SOLUTION) ×2 IMPLANT
PAD ARMBOARD 7.5X6 YLW CONV (MISCELLANEOUS) ×2 IMPLANT
POUCH SPECIMEN RETRIEVAL 10MM (ENDOMECHANICALS) ×2 IMPLANT
SCISSORS LAP 5X35 DISP (ENDOMECHANICALS) ×3 IMPLANT
SET CHOLANGIOGRAPH 5 50 .035 (SET/KITS/TRAYS/PACK) ×2 IMPLANT
SET IRRIG TUBING LAPAROSCOPIC (IRRIGATION / IRRIGATOR) ×2 IMPLANT
SLEEVE ENDOPATH XCEL 5M (ENDOMECHANICALS) ×2 IMPLANT
SPECIMEN JAR SMALL (MISCELLANEOUS) ×2 IMPLANT
SPONGE GAUZE 2X2 STER 10/PKG (GAUZE/BANDAGES/DRESSINGS) ×1
STRIP CLOSURE SKIN 1/2X4 (GAUZE/BANDAGES/DRESSINGS) ×1 IMPLANT
SUT MNCRL AB 4-0 PS2 18 (SUTURE) ×2 IMPLANT
TOWEL OR 17X24 6PK STRL BLUE (TOWEL DISPOSABLE) ×2 IMPLANT
TOWEL OR 17X26 10 PK STRL BLUE (TOWEL DISPOSABLE) ×2 IMPLANT
TRAY LAPAROSCOPIC MC (CUSTOM PROCEDURE TRAY) ×2 IMPLANT
TROCAR XCEL BLUNT TIP 100MML (ENDOMECHANICALS) ×2 IMPLANT
TROCAR XCEL NON-BLD 11X100MML (ENDOMECHANICALS) ×2 IMPLANT
TROCAR XCEL NON-BLD 5MMX100MML (ENDOMECHANICALS) ×2 IMPLANT
TUBING INSUFFLATION (TUBING) ×2 IMPLANT

## 2015-05-11 NOTE — Discharge Instructions (Signed)
°  [facility] ° °LAPAROSCOPIC SURGERY:  POST-OP INSTRUCTIONS ° °Always review your discharge instruction sheet given to you by the facility where your surgery was performed. ° °A prescription for pain medication may be given to you upon discharge.  Take your pain medication as prescribed.  If narcotic pain medicine is not needed, then you may take acetaminophen (Tylenol) or ibuprofen (Advil) as needed. ° °Take your usually prescribed medications unless otherwise directed. ° °If you need a refill on your pain medication, please contact your pharmacy.  They will contact our office to request authorization. Prescriptions will not be filled after 5 P.M. or on weekends. ° °You should follow a light diet the first few days after arrival home, such as soup and crackers or toast.  Be sure to include plenty of fluids daily. ° °Most patients will experience some swelling and bruising in the area of the incisions.  Ice packs will help.  Swelling and bruising can take several days to resolve.  ° °It is common to experience some constipation after surgery.  Increasing fluid intake and taking a stool softener (such as Colace) will usually help or prevent this problem from occurring.  A mild laxative (Milk of Magnesia or Miralax) should be taken according to package instructions if there has been no bowel movement after 48 hours. ° °You will have steri-strips and a gauze dressing over your incisions.  You may remove the gauze bandage on the second day after surgery, and you may shower at that time.  Leave your steri-strips (small skin tapes) in place directly over the incision.  These strips should remain on the skin for 5-7 days and then be removed.  You may get them wet in the shower and pat them dry. ° °Any sutures or staples will be removed at the office during your follow-up visit. ° °ACTIVITIES:  You may resume regular (light) daily activities beginning the next day - such as daily self-care, walking,  climbing stairs - gradually increasing activities as tolerated.  You may have sexual intercourse when it is comfortable.  Refrain from any heavy lifting or straining until approved by your doctor. ° °You may drive when you are no longer taking prescription pain medication, you can comfortably wear a seatbelt, and you can safely maneuver your car and apply brakes. ° °You should see your doctor in the office for a follow-up appointment approximately 2-3 weeks after your surgery.  Make sure that you call for this appointment within a day or two after you arrive home to insure a convenient appointment time. ° °WHEN TO CALL YOUR DOCTOR: °1. Fever over 101.0 °2. Inability to urinate °3. Continued bleeding from incision °4. Increased pain, redness, or drainage from the incision °5. Increasing abdominal pain ° °The clinic staff is available to answer your questions during regular business hours.  Please don’t hesitate to call and ask to speak to one of the nurses for clinical concerns.  If you have a medical emergency, go to the nearest emergency room or call 911.  A surgeon from [facility] is always on call for the hospital. ° °Jazlyne Gauger M. Francisco Eyerly, MD, FACS °[facility] °Office: [phone] °Toll Free:  [phone] °FAX [phone] ° °Website: [email or web address] °

## 2015-05-11 NOTE — Op Note (Signed)
Procedure Note  Pre-operative Diagnosis:  Chronic cholecystitis, cholelithiasis  Post-operative Diagnosis:  same  Surgeon:  Earnstine Regal, MD, FACS  Assistant:  Sharyn Dross, RNFA   Procedure:  Laparoscopic cholecystectomy with intra-operative cholangiography  Anesthesia:  General  Estimated Blood Loss:  minimal  Drains: none         Specimen: Gallbladder to pathology  Indications:  The patient is a 39 year old female who presents for evaluation of gall stones. Patient referred from the emergency department, Dr. Delora Fuel, for treatment of symptomatic cholelithiasis. Patient had presented to the emergency department on April 04, 2015 with abdominal pain radiating to the right flank following an evening meal. Evaluation included laboratory studies which were normal. Ultrasound was performed which showed a 9 mm gallstone lodged in the gallbladder neck. Gallbladder wall was thickened to 7 mm. There was no pericholecystic fluid present. Patient was discharged home with pain medication. She was referred to general surgery by her primary care physician, Dr. Serita Grammes.   Procedure Details:  The patient was seen in the pre-op holding area. The risks, benefits, complications, treatment options, and expected outcomes were previously discussed with the patient. The patient agreed with the proposed plan and has signed the informed consent form.  The patient was brought to the Operating Room, identified as Dawn Patel and the procedure verified as laparoscopic cholecystectomy with intraoperative cholangiography. A "time out" was completed and the above information confirmed.  The patient was placed in the supine position. Following induction of general anesthesia, the abdomen was prepped and draped in the usual aseptic fashion.  An incision was made in the skin near the umbilicus. The midline fascia was incised and the peritoneal cavity was entered and a Hasson canula was  introduced under direct vision.  The Hasson canula was secured with a 0-Vicryl pursestring suture. Pneumoperitoneum was established with carbon dioxide. Additional trocars were introduced under direct vision along the right costal margin in the midline, mid-clavicular line, and anterior axillary line.   The gallbladder was identified and the fundus grasped and retracted cephalad. Adhesions were taken down bluntly and the electrocautery was utilized as needed, taking care not to injure any adjacent structures. The infundibulum was grasped and retracted laterally, exposing the peritoneum overlying the triangle of Calot. The peritoneum was incised and structures exposed with blunt dissection. The cystic duct was clearly identified, bluntly dissected circumferentially, and clipped at the neck of the gallbladder.  An incision was made in the cystic duct and the cholangiogram catheter introduced. The catheter was secured using an ligaclip.  Real-time cholangiography was performed using C-arm fluoroscopy.  There was rapid filling of a normal caliber common bile duct.  There was reflux of contrast into the left and right hepatic ductal systems.  There was free flow distally into the duodenum without filling defect or obstruction.  The catheter was removed from the peritoneal cavity.  The cystic duct was then ligated with surgical clips and divided. The cystic artery was identified, dissected circumferentially, ligated with ligaclips, and divided.  The gallbladder was dissected away from the liver bed using the electrocautery for hemostasis. The gallbladder was completely removed from the liver and placed into an endocatch bag. The gallbladder was removed in the endocatch bag through the umbilical port site and submitted to pathology for review.  The right upper quadrant was irrigated and the gallbladder bed was inspected. Hemostasis was achieved with the electrocautery.  Pneumoperitoneum was released after  viewing removal of the trocars with  good hemostasis noted. The umbilical wound was irrigated and the fascia was then closed with the pursestring suture.  Local anesthetic was infiltrated at all port sites. The skin incisions were closed with 4-0 Monocril subcuticular sutures and steri-strips and dressings were applied.  Instrument, sponge, and needle counts were correct at the conclusion of the case.  The patient was awakened from anesthesia and brought to the recovery room in stable condition.  The patient tolerated the procedure well.   Earnstine Regal, MD, [facility] Office: [phone]

## 2015-05-11 NOTE — Anesthesia Preprocedure Evaluation (Addendum)
Anesthesia Evaluation  Patient identified by MRN, date of birth, ID band Patient awake    Reviewed: Allergy & Precautions, H&P , NPO status , Patient's Chart, lab work & pertinent test results  History of Anesthesia Complications Negative for: history of anesthetic complications  Airway Mallampati: II  TM Distance: >3 FB Neck ROM: Full    Dental no notable dental hx. (+) Teeth Intact, Dental Advisory Given   Pulmonary neg pulmonary ROS,  breath sounds clear to auscultation  Pulmonary exam normal       Cardiovascular negative cardio ROS  Rhythm:Regular Rate:Normal     Neuro/Psych negative neurological ROS  negative psych ROS   GI/Hepatic Neg liver ROS, GERD-  Medicated and Controlled,  Endo/Other  negative endocrine ROS  Renal/GU negative Renal ROS  negative genitourinary   Musculoskeletal   Abdominal   Peds  Hematology negative hematology ROS (+)   Anesthesia Other Findings   Reproductive/Obstetrics negative OB ROS                           Anesthesia Physical Anesthesia Plan  ASA: II  Anesthesia Plan: General   Post-op Pain Management:    Induction: Intravenous  Airway Management Planned: Oral ETT  Additional Equipment:   Intra-op Plan:   Post-operative Plan: Extubation in OR  Informed Consent: I have reviewed the patients History and Physical, chart, labs and discussed the procedure including the risks, benefits and alternatives for the proposed anesthesia with the patient or authorized representative who has indicated his/her understanding and acceptance.   Dental advisory given  Plan Discussed with: CRNA  Anesthesia Plan Comments:         Anesthesia Quick Evaluation

## 2015-05-11 NOTE — Interval H&P Note (Signed)
History and Physical Interval Note:  05/11/2015 10:01 AM  Dawn Patel  has presented today for surgery, with the diagnosis of chronic cholecystitis, cholelithiasis  The various methods of treatment have been discussed with the patient and family. After consideration of risks, benefits and other options for treatment, the patient has consented to    Procedure(s): LAPAROSCOPIC CHOLECYSTECTOMY WITH INTRAOPERATIVE CHOLANGIOGRAM (N/A) as a surgical intervention .    The patient's history has been reviewed, patient examined, no change in status, stable for surgery.  I have reviewed the patient's chart and labs.  Questions were answered to the patient's satisfaction.    Earnstine Regal, MD, [facility] Office: [place]

## 2015-05-11 NOTE — Anesthesia Procedure Notes (Signed)
Procedure Name: Intubation Date/Time: 05/11/2015 10:18 AM Performed by: Luciana Axe K Pre-anesthesia Checklist: Patient identified, Emergency Drugs available, Suction available, Patient being monitored and Timeout performed Patient Re-evaluated:Patient Re-evaluated prior to inductionOxygen Delivery Method: Circle system utilized Preoxygenation: Pre-oxygenation with 100% oxygen Intubation Type: IV induction Ventilation: Mask ventilation without difficulty Laryngoscope Size: Miller and 2 Grade View: Grade I Tube type: Oral Tube size: 7.0 mm Number of attempts: 1 Airway Equipment and Method: Stylet and LTA kit utilized Placement Confirmation: ETT inserted through vocal cords under direct vision,  positive ETCO2,  CO2 detector and breath sounds checked- equal and bilateral Secured at: 22 cm Tube secured with: Tape Dental Injury: Teeth and Oropharynx as per pre-operative assessment

## 2015-05-11 NOTE — Anesthesia Postprocedure Evaluation (Signed)
  Anesthesia Post-op Note  Patient: Dawn Patel  Procedure(s) Performed: Procedure(s): LAPAROSCOPIC CHOLECYSTECTOMY WITH INTRAOPERATIVE CHOLANGIOGRAM (N/A)  Patient Location: PACU  Anesthesia Type:General  Level of Consciousness: awake and alert   Airway and Oxygen Therapy: Patient Spontanous Breathing  Post-op Pain: Controlled  Post-op Assessment: Post-op Vital signs reviewed, Patient's Cardiovascular Status Stable and Respiratory Function Stable  Post-op Vital Signs: Reviewed  Filed Vitals:   05/11/15 1225  BP: 118/79  Pulse: 51  Temp:   Resp: 18    Complications: No apparent anesthesia complications

## 2015-05-11 NOTE — Transfer of Care (Signed)
Immediate Anesthesia Transfer of Care Note  Patient: Dawn Patel  Procedure(s) Performed: Procedure(s): LAPAROSCOPIC CHOLECYSTECTOMY WITH INTRAOPERATIVE CHOLANGIOGRAM (N/A)  Patient Location: PACU  Anesthesia Type:General  Level of Consciousness: awake, oriented and patient cooperative  Airway & Oxygen Therapy: Patient Spontanous Breathing and Patient connected to nasal cannula oxygen  Post-op Assessment: Report given to RN and Post -op Vital signs reviewed and stable  Post vital signs: Reviewed  Last Vitals:  Filed Vitals:   05/11/15 0821  BP: 117/46  Pulse: 62  Temp: 37 C  Resp: 18    Complications: No apparent anesthesia complications

## 2015-05-12 DIAGNOSIS — K8010 Calculus of gallbladder with chronic cholecystitis without obstruction: Secondary | ICD-10-CM | POA: Diagnosis not present

## 2015-05-12 DIAGNOSIS — K219 Gastro-esophageal reflux disease without esophagitis: Secondary | ICD-10-CM | POA: Diagnosis not present

## 2015-05-12 MED ORDER — DIPHENHYDRAMINE HCL 25 MG PO CAPS
25.0000 mg | ORAL_CAPSULE | Freq: Four times a day (QID) | ORAL | Status: DC | PRN
  Administered 2015-05-12: 25 mg via ORAL
  Filled 2015-05-12: qty 1

## 2015-05-12 MED ORDER — HYDROCODONE-ACETAMINOPHEN 5-325 MG PO TABS
1.0000 | ORAL_TABLET | ORAL | Status: DC | PRN
  Administered 2015-05-12: 1 via ORAL
  Filled 2015-05-12: qty 1

## 2015-05-12 MED ORDER — HYDROCODONE-ACETAMINOPHEN 10-325 MG PO TABS: 1.0000 | ORAL_TABLET | Freq: Four times a day (QID) | ORAL | Status: AC | PRN

## 2015-05-12 NOTE — Progress Notes (Signed)
Pt for discharge after lunch.  DC instructions given and explained.  Rx for Vicodin given and explained.

## 2015-05-12 NOTE — Progress Notes (Signed)
1 Day Post-Op  Subjective: Feels fine, tol diet, pain controlled voiding  Objective: Vital signs in last 24 hours: Temp:  [98.2 F (36.8 C)-98.5 F (36.9 C)] 98.5 F (36.9 C) (08/06 0519) Pulse Rate:  [46-62] 59 (08/06 0519) Resp:  [13-20] 18 (08/06 0519) BP: (97-144)/(49-79) 97/49 mmHg (08/06 0519) SpO2:  [96 %-100 %] 98 % (08/06 0519) Weight:  [108.863 kg (240 lb)] 108.863 kg (240 lb) (08/05 1259) Last BM Date: 05/11/15  Intake/Output from previous day: 08/05 0701 - 08/06 0700 In: 2843.3 [P.O.:940; I.V.:1903.3] Out: 700 [Urine:700] Intake/Output this shift:    GI: soft approp tender dressings dry  Lab Results:   Recent Labs  05/09/15 1558  WBC 5.0  HGB 13.2  HCT 38.8  PLT 241   BMET  Recent Labs  05/09/15 1558  NA 138  K 4.0  CL 105  CO2 27  GLUCOSE 78  BUN 7  CREATININE 0.97  CALCIUM 9.4   PT/INR No results for input(s): LABPROT, INR in the last 72 hours. ABG No results for input(s): PHART, HCO3 in the last 72 hours.  Invalid input(s): PCO2, PO2  Studies/Results: Dg Cholangiogram Operative  05/11/2015   CLINICAL DATA:  39 year old female with a history of intraoperative cholangiogram  EXAM: INTRAOPERATIVE CHOLANGIOGRAM  TECHNIQUE: Cholangiographic images from the C-arm fluoroscopic device were submitted for interpretation post-operatively. Please see the procedural report for the amount of contrast and the fluoroscopy time utilized.  COMPARISON:  None.  FINDINGS: Surgical instruments project over the upper abdomen.There is cannulation of the cystic duct/gallbladder neck, with antegrade infusion of contrast. Caliber of the extrahepatic ductal system within normal limits.No large filling defect identified.Free flow of contrast across the ampulla.  IMPRESSION: Intraoperative cholangiogram demonstrates extrahepatic biliary ducts of unremarkable caliber, with no large filling defect identified. Free flow of contrast across the ampulla.  Please refer to the  dictated operative report for full details of intraoperative findings and procedure  Signed,  Dulcy Fanny. Earleen Newport, DO  [facility]  [facility]   Electronically Signed   By: Corrie Mckusick D.O.   On: 05/11/2015 14:55    Anti-infectives: Anti-infectives    Start     Dose/Rate Route Frequency Ordered Stop   05/11/15 0600  ceFAZolin (ANCEF) 3 g in dextrose 5 % 50 mL IVPB     3 g 160 mL/hr over 30 Minutes Intravenous On call to O.R. 05/10/15 1410 05/11/15 1018      Assessment/Plan: Pod 1 lap chole  Vitals likely anesthesia related in young woman, she is up and around without difficulty Dc home today  [facility] 05/12/2015

## 2015-05-12 NOTE — Progress Notes (Signed)
Asked Dawn Patel about the Vicodin order for home since allergy but pt was actually on it PTA.  Pt states she was taking it as well.  Rx given and explained to pt not to take both if has some already at home.  Pt may take benadryl as needed if has a rash.

## 2015-05-14 ENCOUNTER — Encounter (HOSPITAL_COMMUNITY): Payer: Self-pay | Admitting: Surgery

## 2015-05-15 NOTE — Discharge Summary (Signed)
  Physician Discharge Summary [facility]  Patient ID: Dawn Patel MRN: [id number] DOB/AGE: Feb 06, 1976 39 y.o.  Admit date: 05/11/2015 Discharge date: 05/12/2015  Admission Diagnoses:  Chronic cholecystitis, cholelithiasis  Discharge Diagnoses:  Principal Problem:   Chronic cholecystitis with calculus   Discharged Condition: good  Hospital Course: Patient was admitted for observation following gallbladder surgery.  Post op course was uncomplicated.  Pain was well controlled.  Tolerated diet. Patient was prepared for discharge home on POD#1.  Consults: None  Treatments: surgery: lap chole with IOC  Discharge Exam: Blood pressure 97/49, pulse 59, temperature 98.5 F (36.9 C), temperature source Oral, resp. rate 18, height 5\' 6"  (1.676 m), weight 108.863 kg (240 lb), last menstrual period 04/30/2015, SpO2 98 %. See progress note day of discharge.  Disposition: Home     Medication List    STOP taking these medications        HYDROcodone-acetaminophen 5-325 MG per tablet  Commonly known as:  NORCO/VICODIN  Replaced by:  HYDROcodone-acetaminophen 10-325 MG per tablet      TAKE these medications        fluticasone 50 MCG/ACT nasal spray  Commonly known as:  FLONASE  Place 2 sprays into both nostrils daily as needed for allergies or rhinitis.     HYDROcodone-acetaminophen 10-325 MG per tablet  Commonly known as:  NORCO  Take 1 tablet by mouth every 6 (six) hours as needed.     ondansetron 4 MG tablet  Commonly known as:  ZOFRAN  Take 1 tablet (4 mg total) by mouth every 6 (six) hours.     ranitidine 150 MG tablet  Commonly known as:  ZANTAC  Take 150 mg by mouth daily.           Follow-up Information    Follow up with Earnstine Regal, MD. Schedule an appointment as soon as possible for a visit in 3 weeks.   Specialty:  General Surgery   Why:  For wound re-check   Contact information:   [place]  [id number] [phone]       Earnstine Regal, MD, [facility] Office: [phone]   Signed: Earnstine Regal 05/15/2015, 1:54 PM

## 2015-06-27 DIAGNOSIS — Z23 Encounter for immunization: Secondary | ICD-10-CM | POA: Diagnosis not present

## 2015-08-17 DIAGNOSIS — E7800 Pure hypercholesterolemia, unspecified: Secondary | ICD-10-CM | POA: Diagnosis not present

## 2015-08-17 DIAGNOSIS — Z0000 Encounter for general adult medical examination without abnormal findings: Secondary | ICD-10-CM | POA: Diagnosis not present

## 2015-12-20 ENCOUNTER — Other Ambulatory Visit: Payer: Self-pay | Admitting: Family Medicine

## 2015-12-20 DIAGNOSIS — R10811 Right upper quadrant abdominal tenderness: Secondary | ICD-10-CM | POA: Diagnosis not present

## 2015-12-25 ENCOUNTER — Other Ambulatory Visit: Payer: Self-pay | Admitting: Family Medicine

## 2015-12-25 ENCOUNTER — Other Ambulatory Visit: Payer: Self-pay | Admitting: Physician Assistant

## 2015-12-25 DIAGNOSIS — R10811 Right upper quadrant abdominal tenderness: Secondary | ICD-10-CM

## 2015-12-27 ENCOUNTER — Other Ambulatory Visit: Payer: Commercial Managed Care - HMO

## 2015-12-31 ENCOUNTER — Other Ambulatory Visit: Payer: Commercial Managed Care - HMO

## 2015-12-31 ENCOUNTER — Ambulatory Visit
Admission: RE | Admit: 2015-12-31 | Discharge: 2015-12-31 | Disposition: A | Discharge status: Home / Self Care | Payer: Commercial Managed Care - HMO | Source: Ambulatory Visit | Attending: Family Medicine | Admitting: Family Medicine

## 2015-12-31 DIAGNOSIS — R10811 Right upper quadrant abdominal tenderness: Secondary | ICD-10-CM | POA: Diagnosis not present

## 2016-03-07 DIAGNOSIS — N898 Other specified noninflammatory disorders of vagina: Secondary | ICD-10-CM | POA: Diagnosis not present

## 2016-06-04 DIAGNOSIS — M545 Low back pain: Secondary | ICD-10-CM | POA: Diagnosis not present

## 2016-06-04 DIAGNOSIS — Z23 Encounter for immunization: Secondary | ICD-10-CM | POA: Diagnosis not present

## 2016-06-04 DIAGNOSIS — K5900 Constipation, unspecified: Secondary | ICD-10-CM | POA: Diagnosis not present

## 2016-06-04 DIAGNOSIS — T148 Other injury of unspecified body region: Secondary | ICD-10-CM | POA: Diagnosis not present

## 2016-11-14 DIAGNOSIS — J069 Acute upper respiratory infection, unspecified: Secondary | ICD-10-CM | POA: Diagnosis not present

## 2016-12-12 ENCOUNTER — Other Ambulatory Visit: Payer: Self-pay | Admitting: Physician Assistant

## 2016-12-12 ENCOUNTER — Other Ambulatory Visit (HOSPITAL_COMMUNITY)
Admission: RE | Admit: 2016-12-12 | Discharge: 2016-12-12 | Disposition: A | Discharge status: Home / Self Care | Payer: Medicare HMO | Source: Ambulatory Visit | Attending: Family Medicine | Admitting: Family Medicine

## 2016-12-12 DIAGNOSIS — Z124 Encounter for screening for malignant neoplasm of cervix: Secondary | ICD-10-CM | POA: Diagnosis not present

## 2016-12-12 DIAGNOSIS — Z0000 Encounter for general adult medical examination without abnormal findings: Secondary | ICD-10-CM | POA: Diagnosis not present

## 2016-12-12 DIAGNOSIS — Z131 Encounter for screening for diabetes mellitus: Secondary | ICD-10-CM | POA: Diagnosis not present

## 2016-12-16 LAB — CYTOLOGY - PAP
Adequacy: ABSENT
Diagnosis: NEGATIVE

## 2017-02-26 DIAGNOSIS — M25562 Pain in left knee: Secondary | ICD-10-CM | POA: Diagnosis not present

## 2017-06-23 DIAGNOSIS — Z23 Encounter for immunization: Secondary | ICD-10-CM | POA: Diagnosis not present

## 2017-10-07 ENCOUNTER — Other Ambulatory Visit: Payer: Self-pay | Admitting: Family Medicine

## 2017-10-07 ENCOUNTER — Ambulatory Visit
Admission: RE | Admit: 2017-10-07 | Discharge: 2017-10-07 | Disposition: A | Discharge status: Home / Self Care | Payer: Medicare HMO | Source: Ambulatory Visit | Attending: Family Medicine | Admitting: Family Medicine

## 2017-10-07 DIAGNOSIS — M25562 Pain in left knee: Secondary | ICD-10-CM

## 2017-10-07 DIAGNOSIS — M179 Osteoarthritis of knee, unspecified: Secondary | ICD-10-CM | POA: Diagnosis not present

## 2017-11-17 ENCOUNTER — Other Ambulatory Visit: Payer: Self-pay

## 2017-11-17 ENCOUNTER — Emergency Department (HOSPITAL_COMMUNITY)
Admission: EM | Admit: 2017-11-17 | Discharge: 2017-11-17 | Disposition: A | Discharge status: Home / Self Care | Payer: Medicare HMO | Attending: Emergency Medicine | Admitting: Emergency Medicine

## 2017-11-17 ENCOUNTER — Encounter (HOSPITAL_COMMUNITY): Payer: Self-pay | Admitting: Emergency Medicine

## 2017-11-17 ENCOUNTER — Emergency Department (HOSPITAL_COMMUNITY): Payer: Medicare HMO

## 2017-11-17 DIAGNOSIS — Z79899 Other long term (current) drug therapy: Secondary | ICD-10-CM | POA: Diagnosis not present

## 2017-11-17 DIAGNOSIS — M545 Low back pain, unspecified: Secondary | ICD-10-CM

## 2017-11-17 LAB — URINALYSIS, ROUTINE W REFLEX MICROSCOPIC
Bilirubin Urine: NEGATIVE
Glucose, UA: NEGATIVE mg/dL
Hgb urine dipstick: NEGATIVE
Ketones, ur: NEGATIVE mg/dL
Leukocytes, UA: NEGATIVE
Nitrite: NEGATIVE
PROTEIN: NEGATIVE mg/dL
Specific Gravity, Urine: 1.014 (ref 1.005–1.030)
pH: 7 (ref 5.0–8.0)

## 2017-11-17 LAB — POC URINE PREG, ED: Preg Test, Ur: NEGATIVE

## 2017-11-17 MED ORDER — KETOROLAC TROMETHAMINE 30 MG/ML IJ SOLN
30.0000 mg | Freq: Once | INTRAMUSCULAR | Status: AC
  Administered 2017-11-17: 30 mg via INTRAMUSCULAR
  Filled 2017-11-17: qty 1

## 2017-11-17 MED ORDER — METHOCARBAMOL 500 MG PO TABS
500.0000 mg | ORAL_TABLET | Freq: Once | ORAL | Status: AC
Start: 2017-11-17 — End: 2017-11-17
  Administered 2017-11-17: 500 mg via ORAL
  Filled 2017-11-17: qty 1

## 2017-11-17 MED ORDER — ACETAMINOPHEN 500 MG PO TABS
1000.0000 mg | ORAL_TABLET | Freq: Once | ORAL | Status: AC
  Administered 2017-11-17: 1000 mg via ORAL
  Filled 2017-11-17: qty 2

## 2017-11-17 MED ORDER — METHOCARBAMOL 500 MG PO TABS: 500.0000 mg | ORAL_TABLET | Freq: Two times a day (BID) | ORAL | 0 refills | Status: DC

## 2017-11-17 NOTE — ED Provider Notes (Signed)
[place] EMERGENCY DEPARTMENT Provider Note   CSN: [id number] Arrival date & time: 11/17/17  2725     History   Chief Complaint Chief Complaint  Patient presents with  . Back Pain    HPI Dawn Patel is a 42 y.o. female.  HPI  Patient is a 42 year old female with a history of cholelithiasis and subsequent cholecystectomy presenting for acute low back pain.  Patient reports that she was bending over to assist her child with homework yesterday when she felt a "pop" on the right side of her back.  Subsequently, patient has had sharp pain in this area. Patient reports she has a remote history approximately 12 years ago for back injury that occurred with heavy lifting, and went through therapy for this but has had relatively little problem with it in the interim.  Patient denies any muscular weakness of the lower extremities, numbness of lower extremities saddle anesthesia, loss of bowel or bladder control, fevers, chills, or IVDU.  Patient denies dysuria, abdominal pain, or vaginal bleeding.  Patient reports that she tried and all Flexeril prescription as well as ibuprofen without relief.  Patient reports any elevation of the right leg or ambulation exacerbates the pain.  Past Medical History:  Diagnosis Date  . Cholelithiases    with chronic cholecystitis  . Fracture of wrist    left  . Fractured shoulder   . Fractured toe   . GERD (gastroesophageal reflux disease)   . Pneumonia   . Wears glasses     Patient Active Problem List   Diagnosis Date Noted  . Chronic cholecystitis with calculus 05/10/2015    Past Surgical History:  Procedure Laterality Date  . CHOLECYSTECTOMY  05/11/2015  . CHOLECYSTECTOMY N/A 05/11/2015   Procedure: LAPAROSCOPIC CHOLECYSTECTOMY WITH INTRAOPERATIVE CHOLANGIOGRAM;  Surgeon: Armandina Gemma, MD;  Location: [place];  Service: General;  Laterality: N/A;  . KNEE ARTHROSCOPY      OB History    No data available       Home  Medications    Prior to Admission medications   Medication Sig Start Date End Date Taking? Authorizing Provider  fluticasone (FLONASE) 50 MCG/ACT nasal spray Place 2 sprays into both nostrils daily as needed for allergies or rhinitis.    [provider]  ondansetron (ZOFRAN) 4 MG tablet Take 1 tablet (4 mg total) by mouth every 6 (six) hours. Patient not taking: Reported on 05/08/2015 04/04/15   Domenic Moras, PA-C  ranitidine (ZANTAC) 150 MG tablet Take 150 mg by mouth daily.    [provider]    Family History Family History  Problem Relation Age of Onset  . Diabetes Mother   . Hypertension Mother   . Hypertension Father   . Cirrhosis Father   . Diabetes Brother     Social History Social History   Tobacco Use  . Smoking status: Never Smoker  . Smokeless tobacco: Never Used  Substance Use Topics  . Alcohol use: No  . Drug use: No     Allergies   Hydrocodone-acetaminophen and Oxycodone-acetaminophen   Review of Systems Review of Systems  Constitutional: Negative for chills and fever.  Gastrointestinal: Negative for abdominal pain.  Genitourinary: Negative for dysuria and vaginal bleeding.  Musculoskeletal: Positive for back pain. Negative for gait problem.  Neurological: Negative for weakness and numbness.     Physical Exam Updated Vital Signs BP 134/78 (BP Location: Right Arm)   Pulse 60   Temp 98.3 F (36.8 C) (Oral)  LMP  (Approximate)   SpO2 99%   Physical Exam  Constitutional: She appears well-developed and well-nourished. No distress.  Sitting comfortably in bed.  HENT:  Head: Normocephalic and atraumatic.  Eyes: Conjunctivae are normal. Right eye exhibits no discharge. Left eye exhibits no discharge.  EOMs normal to gross examination.  Neck: Normal range of motion.  Cardiovascular: Normal rate, regular rhythm and intact distal pulses.  Intact, 2+ radial pulse.  Pulmonary/Chest:  Normal respiratory effort. Patient converses  comfortably. No audible wheeze or stridor.  Abdominal: She exhibits no distension.  Musculoskeletal: Normal range of motion.  Spine Exam: Inspection/Palpation: No midline tenderness to palpation of cervical, thoracic, or lumbar spine.  No cervical or thoracic paraspinal muscular tenderness.  There is diffuse right-sided lumbar paraspinal muscular tenderness. Strength: 5/5 throughout LE bilaterally (hip flexion/extension, adduction/abduction; knee flexion/extension; foot dorsiflexion/plantarflexion, inversion/eversion; great toe inversion) Sensation: Intact to light touch in proximal and distal LE bilaterally Reflexes: 2+ quadriceps and achilles reflexes Normal and symmetric gait without evidence of foot drop.  Neurological: She is alert.  Cranial nerves intact to gross observation. Patient moves extremities without difficulty.  Skin: Skin is warm and dry. She is not diaphoretic.  Psychiatric: She has a normal mood and affect. Her behavior is normal. Judgment and thought content normal.  Nursing note and vitals reviewed.    ED Treatments / Results  Labs (all labs ordered are listed, but only abnormal results are displayed) Labs Reviewed  URINALYSIS, ROUTINE W REFLEX MICROSCOPIC  POC URINE PREG, ED    EKG  EKG Interpretation None       Radiology Dg Lumbar Spine Complete  Result Date: 11/17/2017 CLINICAL DATA:  Back pain.  Popping sensation. EXAM: LUMBAR SPINE - COMPLETE 4+ VIEW COMPARISON:  No recent prior. FINDINGS: Surgical clips right upper quadrant. No acute bony abnormality identified. No evidence of fracture. Paraspinal soft tissues are normal. Nonspecific air-filled loops of small bowel noted. Stool in the colon. IMPRESSION: No acute bony abnormality. Electronically Signed   By: Marcello Moores  Register   On: 11/17/2017 08:50    Procedures Procedures (including critical care time)  Medications Ordered in ED Medications  ketorolac (TORADOL) 30 MG/ML injection 30 mg (30 mg  Intramuscular Given 11/17/17 0949)  methocarbamol (ROBAXIN) tablet 500 mg (500 mg Oral Given 11/17/17 0949)  acetaminophen (TYLENOL) tablet 1,000 mg (1,000 mg Oral Given 11/17/17 0949)     Initial Impression / Assessment and Plan / ED Course  I have reviewed the triage vital signs and the nursing notes.  Pertinent labs & imaging results that were available during my care of the patient were reviewed by me and considered in my medical decision making (see chart for details).     Patient denies any concerning symptoms suggestive of cauda equina requiring urgent imaging at this time such as loss of sensation in the lower extremities, lower extremity weakness, loss of bowel or bladder control, saddle anesthesia, urinary retention, fever/chills, IVDU. Exam demonstrated no weakness today. No preceding injury or trauma to suggest acute fracture. Doubt pelvic or urinary pathology for patient's acute back pain, as patient denies urinary symptoms, has no evidence of infection on UA, has no CVA tenderness, history/pain not consistent with nephrolithiasis, has no vaginal discharge, and is not pregnant as evidenced by negative POC urine pregnancy test.  Intact and equal distal pulses. Patient given strict return precautions for any symptoms indicating worsening neurologic function in the lower extremities.  Patient had improvement of her pain to a 4 out of 10  with Toradol, Tylenol, and Robaxin.  Will discharge on Robaxin.  Nursing notes reviewed. Vital signs reviewed.   Final Clinical Impressions(s) / ED Diagnoses   Final diagnoses:  None    ED Discharge Orders    None       Tamala Julian 11/17/17 1106    Davonna Belling, MD 11/17/17 1556

## 2017-11-17 NOTE — Discharge Instructions (Signed)
Please see the information and instructions below regarding your visit.  Your diagnoses today include:  1. Acute right-sided low back pain without sciatica    About diagnosis. Most episodes of acute low back pain are self-limited. Your exam was reassuring today that the source of your pain is not affecting the spinal cord and nerves that originate in the spinal cord.   If you have a history of disc herniation or arthritis in your spine, the nerves exiting the spine on one side get inflamed. This can cause severe pain. We call this radiculopathy. We do not always know what causes the sudden inflammation.  Tests performed today include: See side panel of your discharge paperwork for testing performed today. Vital signs are listed at the bottom of these instructions.   X-ray of the back  Urine testing  Medications prescribed:    Take any prescribed medications only as prescribed, and any over the counter medications only as directed on the packaging.  You are prescribed Robaxin, a muscle relaxant. Some common side effects of this medication include:  Feeling sleepy.  Dizziness. Take care upon going from a seated to a standing position.  Dry mouth.  Feeling tired or weak.  Hard stools (constipation).  Upset stomach. These are not all of the side effects that may occur. If you have questions about side effects, call your doctor. Call your primary care provider for medical advice about side effects.  This medication can be sedating. Only take this medication as needed. Please do not combine with alcohol. Do not drive or operate machinery while taking this medication.   This medication can interact with some other medications. Make sure to tell any provider you are taking this medication before they prescribe you a new medication.    Home care instructions:   Low back pain gets worse the longer you stay stationary. Please keep moving and walking as tolerated. There are exercises included  in this packet to perform as tolerated for your low back pain.   Apply heat to the areas that are painful. Avoid twisting or bending your trunk to lift something. Do not lift anything above 25 lbs while recovering from this flare of low back pain.  Please follow any educational materials contained in this packet.   Follow-up instructions: Please follow-up with your primary care provider in as soon as possible for further evaluation of your symptoms if they are not completely improved.   Please follow up with Dr. Griffin Basil in orthopedics.  Return instructions:  Please return to the Emergency Department if you experience worsening symptoms.  Please return for any fever or chills in the setting of your back pain, weakness in the muscles of the legs, numbness in your legs and feet that is new or changing, numbness in the area where you wipe, retention of your urine, loss of bowel or bladder control, or problems with walking. Please return if you have any other emergent concerns.  Additional Information:   Your vital signs today were: BP 120/73 (BP Location: Right Arm)    Pulse 72    Temp 98.3 F (36.8 C) (Oral)    Resp 16    LMP  (Approximate)    SpO2 99%  If your blood pressure (BP) was elevated on multiple readings during this visit above 130 for the top number or above 80 for the bottom number, please have this repeated by your primary care provider within one month. --------------  Thank you for allowing [place] to participate in  your care today.

## 2017-11-17 NOTE — ED Triage Notes (Signed)
Patient from home with complaint of back pain. States she was leaning over a table when she felt a pop in the lower right side of her back and a constant sharp pain started, rated 12/10. No history of back surgeries. Alert and oriented, no weakness but back pain with ambulation.

## 2017-11-25 DIAGNOSIS — M549 Dorsalgia, unspecified: Secondary | ICD-10-CM | POA: Diagnosis not present

## 2017-12-14 DIAGNOSIS — Z131 Encounter for screening for diabetes mellitus: Secondary | ICD-10-CM | POA: Diagnosis not present

## 2017-12-14 DIAGNOSIS — Z136 Encounter for screening for cardiovascular disorders: Secondary | ICD-10-CM | POA: Diagnosis not present

## 2017-12-14 DIAGNOSIS — H547 Unspecified visual loss: Secondary | ICD-10-CM | POA: Diagnosis not present

## 2017-12-14 DIAGNOSIS — Z0000 Encounter for general adult medical examination without abnormal findings: Secondary | ICD-10-CM | POA: Diagnosis not present

## 2018-01-07 DIAGNOSIS — J029 Acute pharyngitis, unspecified: Secondary | ICD-10-CM | POA: Diagnosis not present

## 2018-01-17 ENCOUNTER — Emergency Department (HOSPITAL_COMMUNITY): Payer: Medicare HMO

## 2018-01-17 ENCOUNTER — Emergency Department (HOSPITAL_COMMUNITY)
Admission: EM | Admit: 2018-01-17 | Discharge: 2018-01-17 | Disposition: A | Discharge status: Home / Self Care | Payer: Medicare HMO | Attending: Emergency Medicine | Admitting: Emergency Medicine

## 2018-01-17 ENCOUNTER — Encounter (HOSPITAL_COMMUNITY): Payer: Self-pay | Admitting: Emergency Medicine

## 2018-01-17 ENCOUNTER — Other Ambulatory Visit: Payer: Self-pay

## 2018-01-17 DIAGNOSIS — R05 Cough: Secondary | ICD-10-CM

## 2018-01-17 DIAGNOSIS — Z79899 Other long term (current) drug therapy: Secondary | ICD-10-CM | POA: Diagnosis not present

## 2018-01-17 DIAGNOSIS — R059 Cough, unspecified: Secondary | ICD-10-CM

## 2018-01-17 DIAGNOSIS — R0981 Nasal congestion: Secondary | ICD-10-CM | POA: Diagnosis not present

## 2018-01-17 HISTORY — DX: Legal blindness, as defined in USA: H54.8

## 2018-01-17 MED ORDER — AMOXICILLIN-POT CLAVULANATE 875-125 MG PO TABS: 1.0000 | ORAL_TABLET | Freq: Two times a day (BID) | ORAL | 0 refills | Status: AC

## 2018-01-17 NOTE — ED Notes (Signed)
Cracker and juice given to patient's kids at bedside.

## 2018-01-17 NOTE — Discharge Instructions (Addendum)
Your chest x-ray did not show any pneumonia.     You may have diarrhea from the antibiotics.  It is very important that you continue to take the antibiotics even if you get diarrhea unless a medical professional tells you that you may stop taking them.  If you stop too early the bacteria you are being treated for will become stronger and you may need different, more powerful antibiotics that have more side effects and worsening diarrhea.  Please stay well hydrated and consider probiotics as they may decrease the severity of your diarrhea.  Please be aware that if you take any hormonal contraception (birth control pills, nexplanon, the ring, etc) that your birth control will not work while you are taking antibiotics and you need to use back up protection as directed on the birth control medication information insert.   Please take Ibuprofen (Advil, motrin) and Tylenol (acetaminophen) to relieve your pain.  You may take up to 600 MG (3 pills) of normal strength ibuprofen every 8 hours as needed.  In between doses of ibuprofen you make take tylenol, up to 1,000 mg (two extra strength pills).  Do not take more than 3,000 mg tylenol in a 24 hour period.  Please check all medication labels as many medications such as pain and cold medications may contain tylenol.  Do not drink alcohol while taking these medications.  Do not take other NSAID'S while taking ibuprofen (such as aleve or naproxen).  Please take ibuprofen with food to decrease stomach upset.  Please consider taking a daily allergy medication to help with your symptoms.  I suggest a less drowsy 24 hour medication such as allegra, zyrtec or Claritin or the generic version.  Please start taking a nasal steroid spray either Flonase or Nasacort.   Please get Debrox drops at the drugstore and start using those to help with the wax buildup in your ears.  Please stop using Q-tips.

## 2018-01-17 NOTE — ED Provider Notes (Signed)
[facility] EMERGENCY DEPARTMENT Provider Note   CSN: [id number] Arrival date & time: 01/17/18  6283     History   Chief Complaint Chief Complaint  Patient presents with  . Nasal Congestion  . Facial Pain  . Cough    HPI Dawn Patel is a 42 y.o. female with a history of GERD, legally blind, who presents today for evaluation of cough.  She reports that for the past 2 weeks she has had nasal congestion and facial pain.  She reports chills, denies fevers.  She reports that she has a close contact at work who has walking pneumonia and is concerned she may have pneumonia also.  She denies a double sickening.  She denies any shortness of breath or chest pain.  Her cough is occasionally productive.  She denies ear pain.  She does have a history of seasonal allergies, she reports that her symptoms worsened after she stopped her Flonase.  She has been trying Tylenol severe cough and cold without significant relief.  She reports that she is legally blind and her eyes normally twitch.  She denies any changes.   HPI  Past Medical History:  Diagnosis Date  . Cholelithiases    with chronic cholecystitis  . Fracture of wrist    left  . Fractured shoulder   . Fractured toe   . GERD (gastroesophageal reflux disease)   . Legally blind   . Pneumonia   . Wears glasses     Patient Active Problem List   Diagnosis Date Noted  . Chronic cholecystitis with calculus 05/10/2015    Past Surgical History:  Procedure Laterality Date  . CHOLECYSTECTOMY  05/11/2015  . CHOLECYSTECTOMY N/A 05/11/2015   Procedure: LAPAROSCOPIC CHOLECYSTECTOMY WITH INTRAOPERATIVE CHOLANGIOGRAM;  Surgeon: Armandina Gemma, MD;  Location: [facility];  Service: General;  Laterality: N/A;  . KNEE ARTHROSCOPY       OB History   None      Home Medications    Prior to Admission medications   Medication Sig Start Date End Date Taking? Authorizing Provider  amoxicillin-clavulanate (AUGMENTIN) 875-125 MG  tablet Take 1 tablet by mouth every 12 (twelve) hours for 10 days. 01/17/18 01/27/18  Lorin Glass, PA-C  fluticasone [facility]) 50 MCG/ACT nasal spray Place 2 sprays into both nostrils daily as needed for allergies or rhinitis.    [provider]  methocarbamol (ROBAXIN) 500 MG tablet Take 1 tablet (500 mg total) by mouth 2 (two) times daily. 11/17/17   Langston Masker B, PA-C  ondansetron (ZOFRAN) 4 MG tablet Take 1 tablet (4 mg total) by mouth every 6 (six) hours. Patient not taking: Reported on 05/08/2015 04/04/15   Domenic Moras, PA-C  ranitidine (ZANTAC) 150 MG tablet Take 150 mg by mouth daily.    [provider]    Family History Family History  Problem Relation Age of Onset  . Diabetes Mother   . Hypertension Mother   . Hypertension Father   . Cirrhosis Father   . Diabetes Brother     Social History Social History   Tobacco Use  . Smoking status: Never Smoker  . Smokeless tobacco: Never Used  Substance Use Topics  . Alcohol use: No  . Drug use: No     Allergies   Hydrocodone-acetaminophen and Oxycodone-acetaminophen   Review of Systems Review of Systems  Constitutional: Positive for chills. Negative for fever.  HENT: Positive for congestion, sinus pressure, sinus pain and sore throat (Reports was already checked for strep  and was negative. ).   Eyes: Negative for visual disturbance.  Respiratory: Positive for cough. Negative for shortness of breath.   Gastrointestinal: Negative for nausea.  Neurological: Negative for headaches.     Physical Exam Updated Vital Signs BP (!) 119/54 (BP Location: Right Arm)   Pulse (!) 57   Temp 98.9 F (37.2 C) (Oral)   Resp 14   Ht 5\' 6"  (1.676 m)   Wt 111.1 kg (245 lb)   LMP 01/06/2018   SpO2 100%   BMI 39.54 kg/m   Physical Exam  Constitutional: She appears well-developed and well-nourished. No distress.  HENT:  Head: Normocephalic and atraumatic.  Right Ear: Tympanic membrane, external ear and  ear canal normal.  Left Ear: External ear normal.  Nose: Mucosal edema and rhinorrhea present.  Mouth/Throat: Uvula is midline, oropharynx is clear and moist and mucous membranes are normal. No oropharyngeal exudate. No tonsillar exudate.  Left canal occluded by cerumen.   Eyes: Conjunctivae are normal. No scleral icterus. Right eye exhibits nystagmus. Left eye exhibits nystagmus.  Right eye is deviated  Neck: Normal range of motion. Neck supple.  Cardiovascular: Normal rate and regular rhythm.  Pulmonary/Chest: Effort normal and breath sounds normal. No respiratory distress. She has no wheezes.  Lymphadenopathy:    She has no cervical adenopathy.  Neurological: She is alert.  Skin: Skin is warm and dry. She is not diaphoretic.  Psychiatric: She has a normal mood and affect. Her behavior is normal.  Nursing note and vitals reviewed.    ED Treatments / Results  Labs (all labs ordered are listed, but only abnormal results are displayed) Labs Reviewed - No data to display  EKG None  Radiology Dg Chest 2 View  Result Date: 01/17/2018 CLINICAL DATA:  Pt reports productive cough w/ green/yellow phlegm and centralized chest tightness x 2 weeks EXAM: CHEST - 2 VIEW COMPARISON:  Chest x-ray dated 10/13/2014. FINDINGS: Cardiomediastinal silhouette is within normal limits in size and configuration. Lungs are clear. Lung volumes are normal. No evidence of pneumonia. No pleural effusion. No pneumothorax seen. Osseous and soft tissue structures about the chest are unremarkable. IMPRESSION: No active cardiopulmonary disease. No evidence of pneumonia or pulmonary edema. Electronically Signed   By: Franki Cabot M.D.   On: 01/17/2018 10:17    Procedures Procedures (including critical care time)  Medications Ordered in ED Medications - No data to display   Initial Impression / Assessment and Plan / ED Course  I have reviewed the triage vital signs and the nursing notes.  Pertinent labs &  imaging results that were available during my care of the patient were reviewed by me and considered in my medical decision making (see chart for details).    Patient complaining of symptoms of sinusitis.   Severe symptoms have been present for greater than 10 days with purulent nasal discharge and maxillary sinus pain.  Concern for acute bacterial rhinosinusitis.  Patient discharged with Augmentin.  Instructions given for warm saline nasal wash and recommendations for follow-up with primary care physician.    Also due to her cough and concern with coworkers with similar symptoms having pneumonia chest x-ray was obtained without acute abnormalities.  Based on the time of year also recommended that she start seasonal allergy treatment including a oral second-generation antihistamine along with nasal steroid spray.  Final Clinical Impressions(s) / ED Diagnoses   Final diagnoses:  Cough  Nasal congestion    ED Discharge Orders  Ordered    amoxicillin-clavulanate (AUGMENTIN) 875-125 MG tablet  Every 12 hours     01/17/18 1033       Ollen Gross 01/17/18 1722    Carmin Muskrat, MD 01/21/18 1501

## 2018-01-17 NOTE — ED Triage Notes (Signed)
Pt. Stated, Dawn Patel had a cold with cough, nasal drainage continuously for 2 weeks.

## 2018-04-14 DIAGNOSIS — L608 Other nail disorders: Secondary | ICD-10-CM | POA: Diagnosis not present

## 2018-04-14 DIAGNOSIS — N921 Excessive and frequent menstruation with irregular cycle: Secondary | ICD-10-CM | POA: Diagnosis not present

## 2018-08-13 DIAGNOSIS — Z23 Encounter for immunization: Secondary | ICD-10-CM | POA: Diagnosis not present

## 2018-09-23 IMAGING — DX DG CHEST 2V
2 series · 2 of 2 positions shown · non-contrast
Comparison: Chest x-ray dated 10/13/2014.

CLINICAL DATA: Pt reports productive cough w/ green/yellow phlegm
and centralized chest tightness x 2 weeks

EXAM:
CHEST - 2 VIEW

[w chest pa]
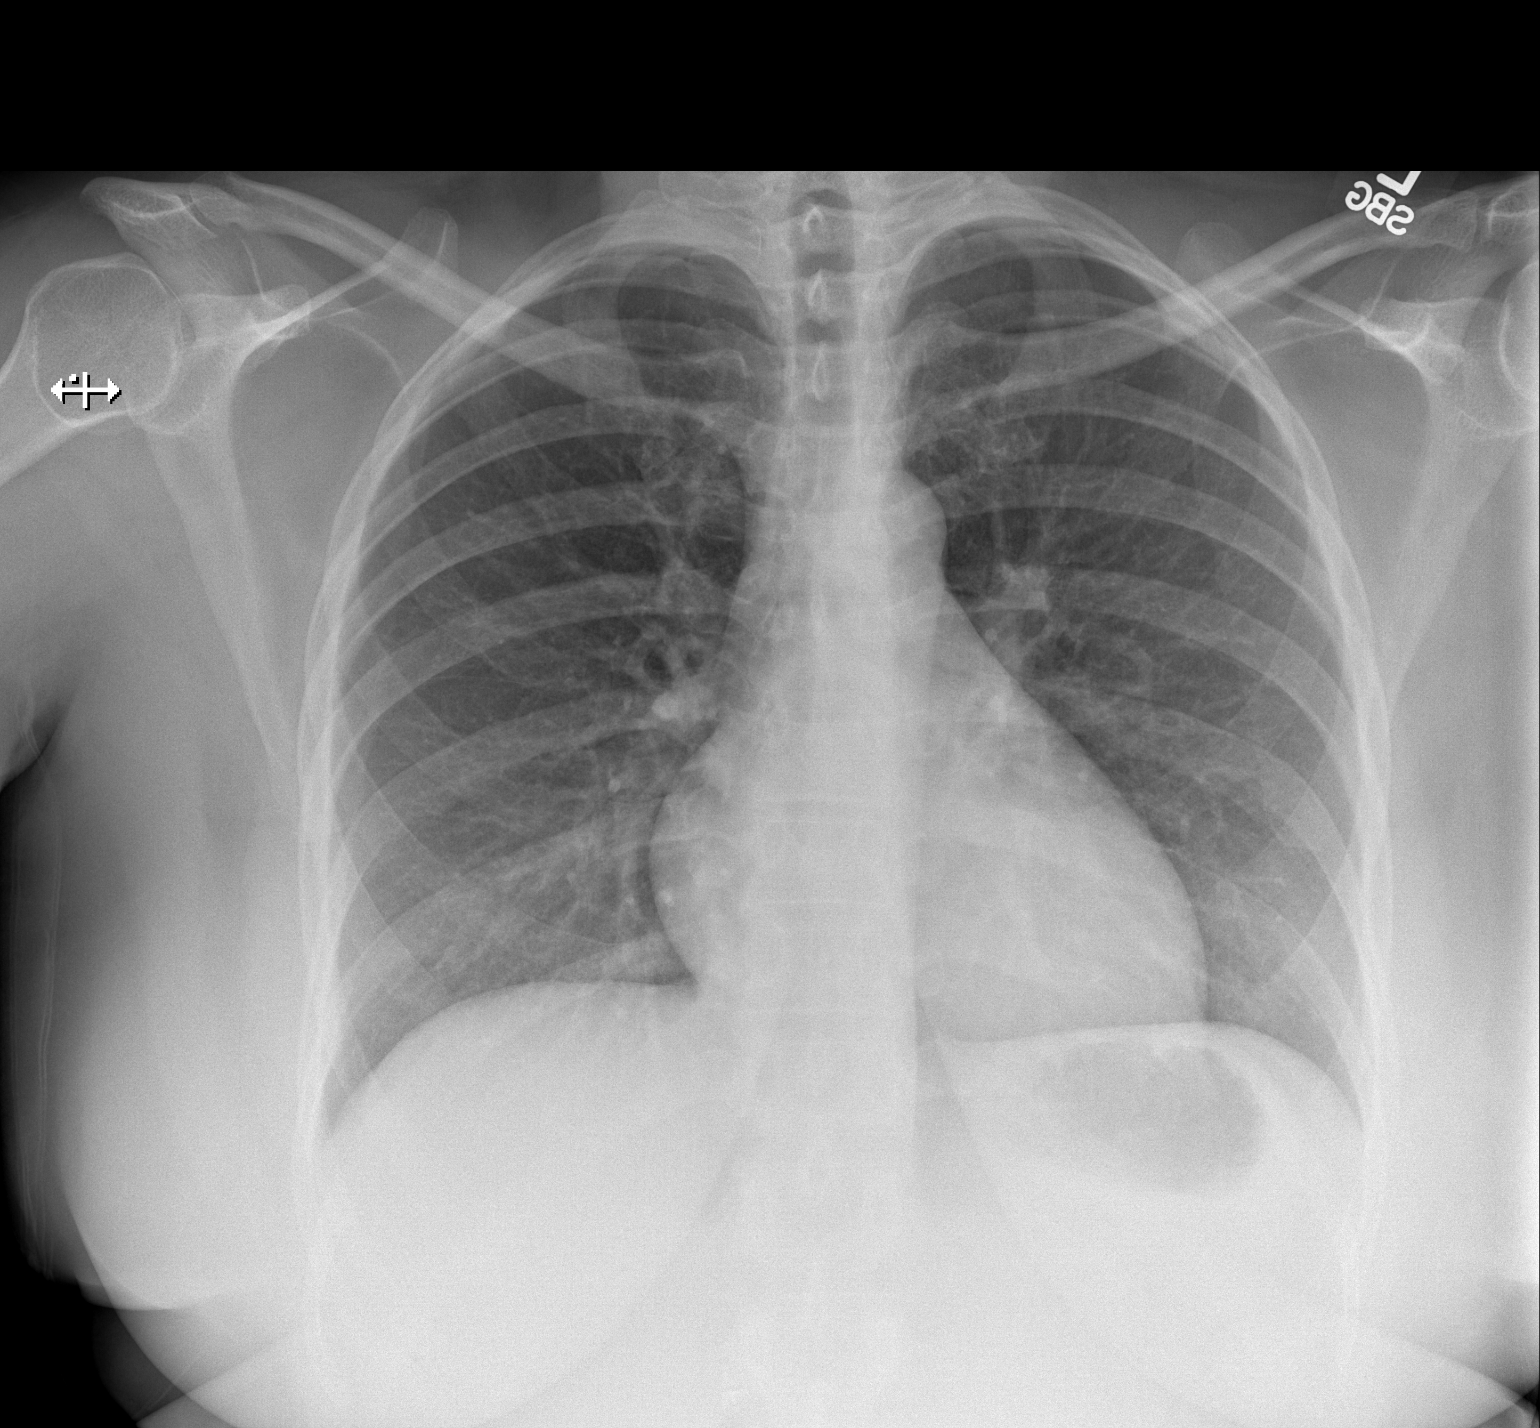

[w chest lat]
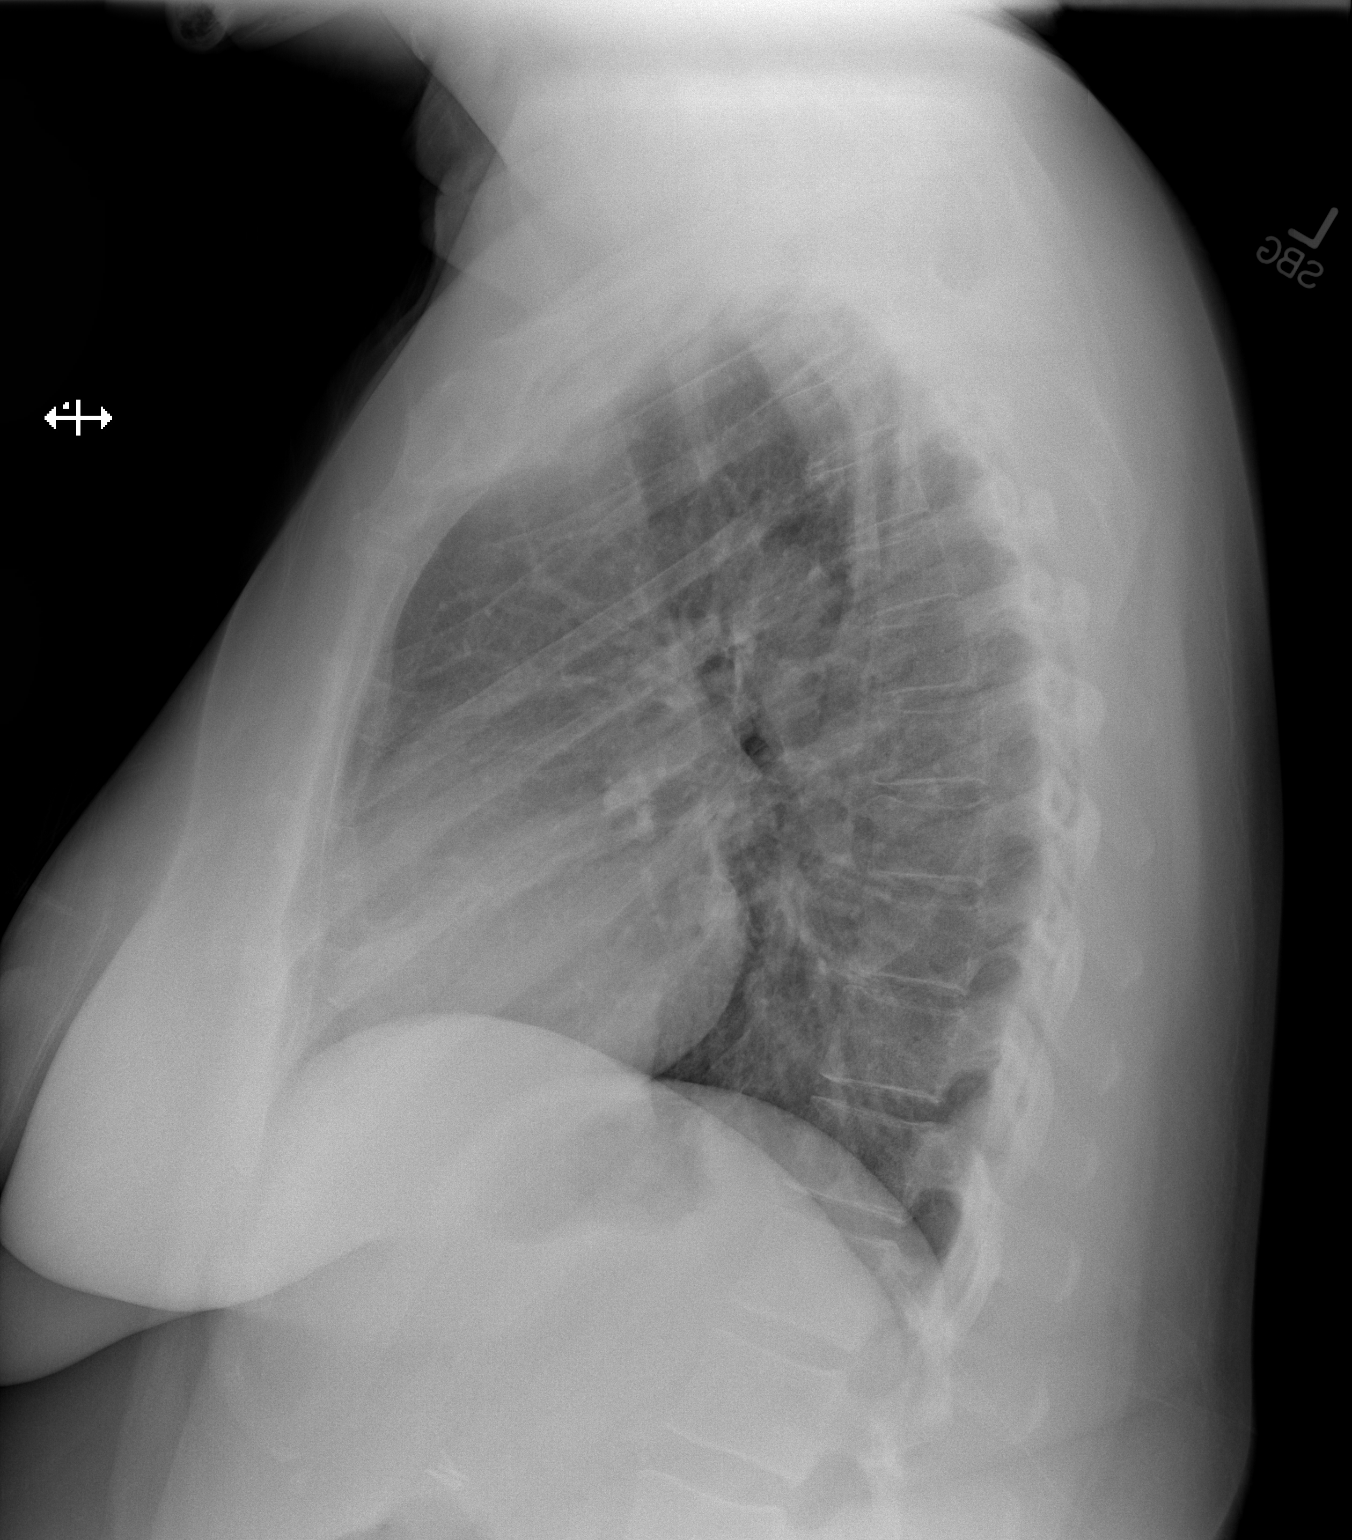

[2 of 2 positions shown; findings below may reference images not displayed]

FINDINGS: Cardiomediastinal silhouette is within normal limits in size and
configuration. Lungs are clear. Lung volumes are normal. No evidence
of pneumonia. No pleural effusion. No pneumothorax seen.

Osseous and soft tissue structures about the chest are unremarkable.
IMPRESSION: No active cardiopulmonary disease. No evidence of pneumonia or
pulmonary edema.

## 2018-11-08 DIAGNOSIS — J069 Acute upper respiratory infection, unspecified: Secondary | ICD-10-CM | POA: Diagnosis not present

## 2018-12-16 DIAGNOSIS — E7800 Pure hypercholesterolemia, unspecified: Secondary | ICD-10-CM | POA: Diagnosis not present

## 2018-12-16 DIAGNOSIS — Z131 Encounter for screening for diabetes mellitus: Secondary | ICD-10-CM | POA: Diagnosis not present

## 2018-12-16 DIAGNOSIS — Z0000 Encounter for general adult medical examination without abnormal findings: Secondary | ICD-10-CM | POA: Diagnosis not present

## 2019-02-20 ENCOUNTER — Other Ambulatory Visit: Payer: Self-pay

## 2019-02-20 ENCOUNTER — Emergency Department (HOSPITAL_COMMUNITY): Payer: Medicare HMO

## 2019-02-20 ENCOUNTER — Encounter (HOSPITAL_COMMUNITY): Payer: Self-pay | Admitting: Oncology

## 2019-02-20 ENCOUNTER — Emergency Department (HOSPITAL_COMMUNITY)
Admission: EM | Admit: 2019-02-20 | Discharge: 2019-02-20 | Disposition: A | Discharge status: Home / Self Care | Payer: Medicare HMO | Attending: Emergency Medicine | Admitting: Emergency Medicine

## 2019-02-20 DIAGNOSIS — K5900 Constipation, unspecified: Secondary | ICD-10-CM | POA: Diagnosis not present

## 2019-02-20 DIAGNOSIS — R1013 Epigastric pain: Secondary | ICD-10-CM | POA: Insufficient documentation

## 2019-02-20 DIAGNOSIS — Z79899 Other long term (current) drug therapy: Secondary | ICD-10-CM | POA: Diagnosis not present

## 2019-02-20 DIAGNOSIS — R110 Nausea: Secondary | ICD-10-CM | POA: Diagnosis not present

## 2019-02-20 LAB — COMPREHENSIVE METABOLIC PANEL
ALT: 16 U/L (ref 0–44)
AST: 17 U/L (ref 15–41)
Albumin: 3.6 g/dL (ref 3.5–5.0)
Alkaline Phosphatase: 66 U/L (ref 38–126)
Anion gap: 12 (ref 5–15)
BUN: 7 mg/dL (ref 6–20)
CO2: 22 mmol/L (ref 22–32)
Calcium: 9 mg/dL (ref 8.9–10.3)
Chloride: 105 mmol/L (ref 98–111)
Creatinine, Ser: 0.92 mg/dL (ref 0.44–1.00)
GFR calc Af Amer: >60 mL/min (ref >60)
GFR calc non Af Amer: >60 mL/min (ref >60)
Glucose, Bld: 101 mg/dL — ABNORMAL HIGH (ref 70–99)
Potassium: 3.2 mmol/L — ABNORMAL LOW (ref 3.5–5.1)
Sodium: 139 mmol/L (ref 135–145)
Total Bilirubin: 0.7 mg/dL (ref 0.3–1.2)
Total Protein: 6.3 g/dL — ABNORMAL LOW (ref 6.5–8.1)

## 2019-02-20 LAB — CBC WITH DIFFERENTIAL/PLATELET
Abs Immature Granulocytes: 0.02 10*3/uL (ref 0.00–0.07)
Basophils Absolute: 0 10*3/uL (ref 0.0–0.1)
Basophils Relative: 0 %
Eosinophils Absolute: 0.1 10*3/uL (ref 0.0–0.5)
Eosinophils Relative: 2 %
HCT: 38.6 % (ref 36.0–46.0)
Hemoglobin: 13.3 g/dL (ref 12.0–15.0)
Immature Granulocytes: 0 %
Lymphocytes Relative: 43 %
Lymphs Abs: 3.2 10*3/uL (ref 0.7–4.0)
MCH: 30.3 pg (ref 26.0–34.0)
MCHC: 34.5 g/dL (ref 30.0–36.0)
MCV: 87.9 fL (ref 80.0–100.0)
Monocytes Absolute: 0.7 10*3/uL (ref 0.1–1.0)
Monocytes Relative: 9 %
Neutro Abs: 3.4 10*3/uL (ref 1.7–7.7)
Neutrophils Relative %: 46 %
Platelets: 254 10*3/uL (ref 150–400)
RBC: 4.39 MIL/uL (ref 3.87–5.11)
RDW: 11.9 % (ref 11.5–15.5)
WBC: 7.4 10*3/uL (ref 4.0–10.5)
nRBC: 0 % (ref 0.0–0.2)

## 2019-02-20 LAB — URINALYSIS, ROUTINE W REFLEX MICROSCOPIC
Bacteria, UA: NONE SEEN
Bilirubin Urine: NEGATIVE
Glucose, UA: NEGATIVE mg/dL
Ketones, ur: NEGATIVE mg/dL
Leukocytes,Ua: NEGATIVE
Nitrite: NEGATIVE
Protein, ur: NEGATIVE mg/dL
Specific Gravity, Urine: 1.005 (ref 1.005–1.030)
pH: 6 (ref 5.0–8.0)

## 2019-02-20 LAB — I-STAT BETA HCG BLOOD, ED (MC, WL, AP ONLY): I-stat hCG, quantitative: <5 m[IU]/mL (ref <5)

## 2019-02-20 LAB — LIPASE, BLOOD: Lipase: 25 U/L (ref 11–51)

## 2019-02-20 MED ORDER — HYDROMORPHONE HCL 1 MG/ML IJ SOLN
0.5000 mg | Freq: Once | INTRAMUSCULAR | Status: AC
  Administered 2019-02-20: 0.5 mg via INTRAVENOUS
  Filled 2019-02-20: qty 1

## 2019-02-20 MED ORDER — POLYETHYLENE GLYCOL 3350 17 GM/SCOOP PO POWD: 17.0000 g | Freq: Every day | ORAL | 0 refills | Status: AC

## 2019-02-20 MED ORDER — OMEPRAZOLE 20 MG PO CPDR: 20.0000 mg | DELAYED_RELEASE_CAPSULE | Freq: Every day | ORAL | 0 refills | Status: DC

## 2019-02-20 MED ORDER — DICYCLOMINE HCL 20 MG PO TABS: 20.0000 mg | ORAL_TABLET | Freq: Two times a day (BID) | ORAL | 0 refills | Status: DC

## 2019-02-20 MED ORDER — POTASSIUM CHLORIDE CRYS ER 20 MEQ PO TBCR
40.0000 meq | EXTENDED_RELEASE_TABLET | Freq: Once | ORAL | Status: AC
  Administered 2019-02-20: 40 meq via ORAL
  Filled 2019-02-20: qty 2

## 2019-02-20 MED ORDER — ONDANSETRON HCL 4 MG/2ML IJ SOLN
4.0000 mg | Freq: Once | INTRAMUSCULAR | Status: AC
Start: 2019-02-20 — End: 2019-02-20
  Administered 2019-02-20: 4 mg via INTRAVENOUS
  Filled 2019-02-20: qty 2

## 2019-02-20 NOTE — Discharge Instructions (Addendum)
Please read and follow all provided instructions.  Your diagnoses today include:  1. Epigastric abdominal pain     Tests performed today include: Blood counts and electrolytes Blood tests to check liver and kidney function Blood tests to check pancreas function Urine test to look for infection X-ray - shows moderate amount of stool on right side Vital signs. See below for your results today.   Medications prescribed:  Bentyl - medication for intestinal cramps and spasms  Omeprazole (Prilosec) - stomach acid reducer  This medication can be found over-the-counter  Miralax - laxative  This medication can be found over-the-counter.     Take any prescribed medications only as directed.  Home care instructions:  Follow any educational materials contained in this packet.  Follow-up instructions: Please follow-up with your primary care provider in the next 2 days for further evaluation of your symptoms.    Return instructions:  SEEK IMMEDIATE MEDICAL ATTENTION IF: The pain does not go away or becomes severe  A temperature above 101F develops  Repeated vomiting occurs (multiple episodes)  The pain becomes localized to portions of the abdomen. The right side could possibly be appendicitis. In an adult, the left lower portion of the abdomen could be colitis or diverticulitis.  Blood is being passed in stools or vomit (bright red or black tarry stools)  You develop chest pain, difficulty breathing, dizziness or fainting, or become confused, poorly responsive, or inconsolable (young children) If you have any other emergent concerns regarding your health  Additional Information: Abdominal (belly) pain can be caused by many things. Your caregiver performed an examination and possibly ordered blood/urine tests and imaging (CT scan, x-rays, ultrasound). Many cases can be observed and treated at home after initial evaluation in the emergency department. Even though you are being  discharged home, abdominal pain can be unpredictable. Therefore, you need a repeated exam if your pain does not resolve, returns, or worsens. Most patients with abdominal pain don't have to be admitted to the hospital or have surgery, but serious problems like appendicitis and gallbladder attacks can start out as nonspecific pain. Many abdominal conditions cannot be diagnosed in one visit, so follow-up evaluations are very important.  Your vital signs today were: BP 111/69    Pulse (!) 59    Temp 98.3 F (36.8 C) (Oral)    Resp 11    Ht 5\' 7"  (1.702 m)    Wt 108.9 kg    LMP 02/18/2019 (Exact Date)    SpO2 97%    BMI 37.59 kg/m  If your blood pressure (bp) was elevated above 135/85 this visit, please have this repeated by your doctor within one month. --------------

## 2019-02-20 NOTE — ED Triage Notes (Signed)
Pt c/o upper abdominal pain x 1 week.  Believes she is constipated. States she has tried stool softeners w/o relief. Last BM 02/16/19.  Rates pain 8/10, pressure in nature.

## 2019-02-20 NOTE — ED Provider Notes (Signed)
[facility] EMERGENCY DEPARTMENT Provider Note   CSN: [id number] Arrival date & time: 02/20/19  7829    History   Chief Complaint Chief Complaint  Patient presents with  . Abdominal Pain    HPI Dawn Patel is a 43 y.o. female.     Patient with history of cholecystectomy approximately 3 years ago presents to the emergency department with complaint of right upper quadrant abdominal pain.  She has gotten similar pains over the past 3 years since her surgery.  Pain is been worse this week, intermittent.  It has been associated with nausea and decreased appetite.  No fevers, chest pain, shortness of breath, cough, vomiting.  She denies any urinary symptoms including hematuria, increased frequency urgency.  Patient denies history of kidney stones.  The pain radiates to her flank.  No radiation to her back or shoulder.  Patient reports recent oral surgery about 2 weeks ago.  Post surgery she has been taking more Tylenol and ibuprofen than she typically has in the past.  She denies alcohol use.  No narcotic use.  No blood noted in the stools.  Constipation is also chronic in nature however worse recently.  Last bowel movement 4 days ago.     Past Medical History:  Diagnosis Date  . Cholelithiases    with chronic cholecystitis  . Fracture of wrist    left  . Fractured shoulder   . Fractured toe   . GERD (gastroesophageal reflux disease)   . Legally blind   . Pneumonia   . Wears glasses     Patient Active Problem List   Diagnosis Date Noted  . Chronic cholecystitis with calculus 05/10/2015    Past Surgical History:  Procedure Laterality Date  . CHOLECYSTECTOMY  05/11/2015  . CHOLECYSTECTOMY N/A 05/11/2015   Procedure: LAPAROSCOPIC CHOLECYSTECTOMY WITH INTRAOPERATIVE CHOLANGIOGRAM;  Surgeon: Armandina Gemma, MD;  Location: [facility];  Service: General;  Laterality: N/A;  . KNEE ARTHROSCOPY       OB History   No obstetric history on file.      Home  Medications    Prior to Admission medications   Medication Sig Start Date End Date Taking? Authorizing Provider  fluticasone (FLONASE) 50 MCG/ACT nasal spray Place 2 sprays into both nostrils daily as needed for allergies or rhinitis.    [provider]  methocarbamol (ROBAXIN) 500 MG tablet Take 1 tablet (500 mg total) by mouth 2 (two) times daily. 11/17/17   Langston Masker B, PA-C  ondansetron (ZOFRAN) 4 MG tablet Take 1 tablet (4 mg total) by mouth every 6 (six) hours. Patient not taking: Reported on 05/08/2015 04/04/15   Domenic Moras, PA-C  ranitidine (ZANTAC) 150 MG tablet Take 150 mg by mouth daily.    [provider]    Family History Family History  Problem Relation Age of Onset  . Diabetes Mother   . Hypertension Mother   . Hypertension Father   . Cirrhosis Father   . Diabetes Brother     Social History Social History   Tobacco Use  . Smoking status: Never Smoker  . Smokeless tobacco: Never Used  Substance Use Topics  . Alcohol use: No  . Drug use: No     Allergies   Hydrocodone-acetaminophen and Oxycodone-acetaminophen   Review of Systems Review of Systems  Constitutional: Negative for fever.  HENT: Negative for rhinorrhea and sore throat.   Eyes: Negative for redness.  Respiratory: Negative for cough and shortness of breath.   Cardiovascular:  Negative for chest pain.  Gastrointestinal: Positive for abdominal pain, constipation and nausea. Negative for blood in stool, diarrhea and vomiting.  Genitourinary: Positive for flank pain. Negative for dysuria, frequency and urgency.  Musculoskeletal: Negative for myalgias.  Skin: Negative for rash.  Neurological: Negative for headaches.     Physical Exam Updated Vital Signs BP 123/75 (BP Location: Right Arm)   Pulse 69   Temp 98.3 F (36.8 C) (Oral)   Resp 12   Ht 5\' 7"  (1.702 m)   Wt 108.9 kg   LMP 02/18/2019 (Exact Date)   SpO2 98%   BMI 37.59 kg/m   Physical Exam Vitals signs and  nursing note reviewed.  Constitutional:      Appearance: She is well-developed.  HENT:     Head: Normocephalic and atraumatic.  Eyes:     General:        Right eye: No discharge.        Left eye: No discharge.     Conjunctiva/sclera: Conjunctivae normal.  Neck:     Musculoskeletal: Normal range of motion and neck supple.  Cardiovascular:     Rate and Rhythm: Normal rate and regular rhythm.     Heart sounds: Normal heart sounds.  Pulmonary:     Effort: Pulmonary effort is normal.     Breath sounds: Normal breath sounds.  Abdominal:     Palpations: Abdomen is soft.     Tenderness: There is abdominal tenderness in the right upper quadrant and epigastric area. There is no guarding or rebound. Negative signs include Murphy's sign.  Skin:    General: Skin is warm and dry.  Neurological:     Mental Status: She is alert.      ED Treatments / Results  Labs (all labs ordered are listed, but only abnormal results are displayed) Labs Reviewed  COMPREHENSIVE METABOLIC PANEL - Abnormal; Notable for the following components:      Result Value   Potassium 3.2 (*)    Glucose, Bld 101 (*)    Total Protein 6.3 (*)    All other components within normal limits  CBC WITH DIFFERENTIAL/PLATELET  LIPASE, BLOOD  URINALYSIS, ROUTINE W REFLEX MICROSCOPIC  I-STAT BETA HCG BLOOD, ED ([facility], [facility], [facility] ONLY)    EKG None  Radiology Dg Abdomen 1 View  Result Date: 02/20/2019 CLINICAL DATA:  Upper abdominal pain x1 week, constipation EXAM: ABDOMEN - 1 VIEW COMPARISON:  None. FINDINGS: Nonobstructive bowel gas pattern. Mild to moderate right colonic stool burden. Left colon appears normal. Cholecystectomy clips. Visualized osseous structures are within normal limits. IMPRESSION: No evidence of bowel obstruction. Mild to moderate right colonic stool burden. Electronically Signed   By: Julian Hy M.D.   On: 02/20/2019 04:17    Procedures Procedures (including critical care time)  Medications  Ordered in ED Medications  HYDROmorphone (DILAUDID) injection 0.5 mg (0.5 mg Intravenous Given 02/20/19 0352)  ondansetron (ZOFRAN) injection 4 mg (4 mg Intravenous Given 02/20/19 0352)  potassium chloride SA (K-DUR) CR tablet 40 mEq (40 mEq Oral Given 02/20/19 0537)     Initial Impression / Assessment and Plan / ED Course  I have reviewed the triage vital signs and the nursing notes.  Pertinent labs & imaging results that were available during my care of the patient were reviewed by me and considered in my medical decision making (see chart for details).        Patient seen and examined.  Medication for treatment of pain ordered.  Will check lab  work, UA, KUB.  Patient with previous gallbladder surgery.  She does not seem to have any respiratory symptoms.  Differential includes gastritis, duodenitis, peptic ulcer disease, hepatitis, renal colic, pyelonephritis, constipation.  No peritoneal signs on initial exam.  KUB ordered to assess stool burden.  Vital signs reviewed and are as follows: BP 123/75 (BP Location: Right Arm)   Pulse 69   Temp 98.3 F (36.8 C) (Oral)   Resp 12   Ht 5\' 7"  (1.702 m)   Wt 108.9 kg   LMP 02/18/2019 (Exact Date)   SpO2 98%   BMI 37.59 kg/m   7:16 AM patient reevaluated.  She is feeling better.  Patient updated on all results.  UA is the only outstanding test.  Current plan is to discharge to home with MiraLAX, Bentyl, omeprazole.  Suspect that she has an element of gastritis or duodenitis.  We will provide with GI referral.  The patient was urged to return to the Emergency Department immediately with worsening of current symptoms, worsening abdominal pain, persistent vomiting, blood noted in stools, fever, or any other concerns. The patient verbalized understanding.     Final Clinical Impressions(s) / ED Diagnoses   Final diagnoses:  Epigastric abdominal pain   Patient with epigastric pain and cramping, constipation, previous cholecystectomy.  Lab  work tonight is reassuring.  Recent increased NSAID use concerning for gastritis/duodenitis.  No rebound or guarding on exam.  Normal white blood cell count.  Normal liver and pancreas testing.  Normal kidney function.  Mild hypokalemia, repleted orally.  ED Discharge Orders         Ordered    omeprazole (PRILOSEC) 20 MG capsule  Daily     02/20/19 0701    dicyclomine (BENTYL) 20 MG tablet  2 times daily     02/20/19 0701    polyethylene glycol powder (GLYCOLAX/MIRALAX) 17 GM/SCOOP powder  Daily     02/20/19 0701           Carlisle Cater, PA-C 02/20/19 0718    Ward, Delice Bison, DO 02/21/19 1545

## 2019-03-03 DIAGNOSIS — K5900 Constipation, unspecified: Secondary | ICD-10-CM | POA: Diagnosis not present

## 2019-03-03 DIAGNOSIS — R109 Unspecified abdominal pain: Secondary | ICD-10-CM | POA: Diagnosis not present

## 2019-03-03 DIAGNOSIS — K219 Gastro-esophageal reflux disease without esophagitis: Secondary | ICD-10-CM | POA: Diagnosis not present

## 2019-04-15 ENCOUNTER — Other Ambulatory Visit: Payer: Self-pay

## 2019-04-15 ENCOUNTER — Encounter (HOSPITAL_COMMUNITY): Payer: Self-pay

## 2019-04-15 ENCOUNTER — Emergency Department (HOSPITAL_COMMUNITY)
Admission: EM | Admit: 2019-04-15 | Discharge: 2019-04-15 | Disposition: A | Discharge status: Home / Self Care | Payer: Medicare HMO | Attending: Emergency Medicine | Admitting: Emergency Medicine

## 2019-04-15 DIAGNOSIS — R1013 Epigastric pain: Secondary | ICD-10-CM | POA: Insufficient documentation

## 2019-04-15 DIAGNOSIS — R197 Diarrhea, unspecified: Secondary | ICD-10-CM | POA: Insufficient documentation

## 2019-04-15 DIAGNOSIS — R110 Nausea: Secondary | ICD-10-CM | POA: Insufficient documentation

## 2019-04-15 DIAGNOSIS — Z79899 Other long term (current) drug therapy: Secondary | ICD-10-CM | POA: Insufficient documentation

## 2019-04-15 LAB — COMPREHENSIVE METABOLIC PANEL
ALT: 13 U/L (ref 0–44)
AST: 13 U/L — ABNORMAL LOW (ref 15–41)
Albumin: 4 g/dL (ref 3.5–5.0)
Alkaline Phosphatase: 64 U/L (ref 38–126)
Anion gap: 11 (ref 5–15)
BUN: 5 mg/dL — ABNORMAL LOW (ref 6–20)
CO2: 23 mmol/L (ref 22–32)
Calcium: 9.4 mg/dL (ref 8.9–10.3)
Chloride: 105 mmol/L (ref 98–111)
Creatinine, Ser: 0.99 mg/dL (ref 0.44–1.00)
GFR calc Af Amer: >60 mL/min (ref >60)
GFR calc non Af Amer: >60 mL/min (ref >60)
Glucose, Bld: 84 mg/dL (ref 70–99)
Potassium: 3.9 mmol/L (ref 3.5–5.1)
Sodium: 139 mmol/L (ref 135–145)
Total Bilirubin: 0.7 mg/dL (ref 0.3–1.2)
Total Protein: 6.7 g/dL (ref 6.5–8.1)

## 2019-04-15 LAB — URINALYSIS, ROUTINE W REFLEX MICROSCOPIC
Bilirubin Urine: NEGATIVE
Glucose, UA: NEGATIVE mg/dL
Ketones, ur: 20 mg/dL — AB
Nitrite: NEGATIVE
Protein, ur: NEGATIVE mg/dL
Specific Gravity, Urine: 1.019 (ref 1.005–1.030)
pH: 6 (ref 5.0–8.0)

## 2019-04-15 LAB — I-STAT BETA HCG BLOOD, ED (MC, WL, AP ONLY): I-stat hCG, quantitative: <5 m[IU]/mL (ref <5)

## 2019-04-15 LAB — CBC
HCT: 43.5 % (ref 36.0–46.0)
Hemoglobin: 14.6 g/dL (ref 12.0–15.0)
MCH: 29.7 pg (ref 26.0–34.0)
MCHC: 33.6 g/dL (ref 30.0–36.0)
MCV: 88.6 fL (ref 80.0–100.0)
Platelets: 282 10*3/uL (ref 150–400)
RBC: 4.91 MIL/uL (ref 3.87–5.11)
RDW: 11.7 % (ref 11.5–15.5)
WBC: 5.3 10*3/uL (ref 4.0–10.5)
nRBC: 0 % (ref 0.0–0.2)

## 2019-04-15 LAB — LIPASE, BLOOD: Lipase: 20 U/L (ref 11–51)

## 2019-04-15 MED ORDER — METOCLOPRAMIDE HCL 10 MG PO TABS
10.0000 mg | ORAL_TABLET | Freq: Once | ORAL | Status: AC
  Administered 2019-04-15: 10 mg via ORAL
  Filled 2019-04-15: qty 1

## 2019-04-15 MED ORDER — PANTOPRAZOLE SODIUM 20 MG PO TBEC: 20.0000 mg | DELAYED_RELEASE_TABLET | Freq: Every day | ORAL | 0 refills | Status: DC

## 2019-04-15 MED ORDER — ALUM & MAG HYDROXIDE-SIMETH 200-200-20 MG/5ML PO SUSP
30.0000 mL | Freq: Once | ORAL | Status: AC
  Administered 2019-04-15: 30 mL via ORAL
  Filled 2019-04-15: qty 30

## 2019-04-15 MED ORDER — LIDOCAINE VISCOUS HCL 2 % MT SOLN
15.0000 mL | Freq: Once | OROMUCOSAL | Status: AC
  Administered 2019-04-15: 15 mL via ORAL
  Filled 2019-04-15: qty 15

## 2019-04-15 MED ORDER — SODIUM CHLORIDE 0.9% FLUSH
3.0000 mL | Freq: Once | INTRAVENOUS | Status: AC
  Administered 2019-04-15: 22:00:00 3 mL via INTRAVENOUS

## 2019-04-15 MED ORDER — METOCLOPRAMIDE HCL 10 MG PO TABS: 10.0000 mg | ORAL_TABLET | Freq: Two times a day (BID) | ORAL | 0 refills | Status: DC | PRN

## 2019-04-15 NOTE — ED Provider Notes (Signed)
[facility] EMERGENCY DEPARTMENT Provider Note   CSN: [id number] Arrival date & time: 04/15/19  1621    History   Chief Complaint Chief Complaint  Patient presents with  . Abdominal Pain  . Nausea    HPI Dawn Patel is a 43 y.o. female.     The history is provided by the patient.  Abdominal Pain Pain location:  Epigastric Pain quality: aching, bloating and dull   Pain radiates to:  Does not radiate Pain severity:  Mild Onset quality:  Gradual Timing:  Intermittent Progression:  Waxing and waning Chronicity:  New Context: previous surgery   Context: not alcohol use   Relieved by:  Nothing Worsened by:  Eating Associated symptoms: diarrhea and nausea   Associated symptoms: no chest pain, no chills, no cough, no dysuria, no fever, no hematuria, no shortness of breath, no sore throat and no vomiting   Risk factors: multiple surgeries   Risk factors: no alcohol abuse     Past Medical History:  Diagnosis Date  . Cholelithiases    with chronic cholecystitis  . Fracture of wrist    left  . Fractured shoulder   . Fractured toe   . GERD (gastroesophageal reflux disease)   . Legally blind   . Pneumonia   . Wears glasses     Patient Active Problem List   Diagnosis Date Noted  . Chronic cholecystitis with calculus 05/10/2015    Past Surgical History:  Procedure Laterality Date  . CHOLECYSTECTOMY  05/11/2015  . CHOLECYSTECTOMY N/A 05/11/2015   Procedure: LAPAROSCOPIC CHOLECYSTECTOMY WITH INTRAOPERATIVE CHOLANGIOGRAM;  Surgeon: Armandina Gemma, MD;  Location: [facility];  Service: General;  Laterality: N/A;  . KNEE ARTHROSCOPY       OB History   No obstetric history on file.      Home Medications    Prior to Admission medications   Medication Sig Start Date End Date Taking? Authorizing Provider  dicyclomine (BENTYL) 20 MG tablet Take 1 tablet (20 mg total) by mouth 2 (two) times daily. 02/20/19   Carlisle Cater, PA-C  metoCLOPramide (REGLAN)  10 MG tablet Take 1 tablet (10 mg total) by mouth 2 (two) times daily as needed for up to 10 doses for nausea. 04/15/19   Jamielynn Wigley, DO  omeprazole (PRILOSEC) 20 MG capsule Take 1 capsule (20 mg total) by mouth daily. 02/20/19   Carlisle Cater, PA-C  pantoprazole (PROTONIX) 20 MG tablet Take 1 tablet (20 mg total) by mouth daily. 04/15/19 05/15/19  Allisyn Kunz, DO  polyethylene glycol powder (GLYCOLAX/MIRALAX) 17 GM/SCOOP powder Take 17 g by mouth daily. 02/20/19   Carlisle Cater, PA-C    Family History Family History  Problem Relation Age of Onset  . Diabetes Mother   . Hypertension Mother   . Hypertension Father   . Cirrhosis Father   . Diabetes Brother     Social History Social History   Tobacco Use  . Smoking status: Never Smoker  . Smokeless tobacco: Never Used  Substance Use Topics  . Alcohol use: No  . Drug use: No     Allergies   Hydrocodone-acetaminophen and Oxycodone-acetaminophen   Review of Systems Review of Systems  Constitutional: Negative for chills and fever.  HENT: Negative for ear pain and sore throat.   Eyes: Negative for pain and visual disturbance.  Respiratory: Negative for cough and shortness of breath.   Cardiovascular: Negative for chest pain and palpitations.  Gastrointestinal: Positive for abdominal pain, diarrhea and nausea. Negative for  vomiting.  Genitourinary: Negative for dysuria and hematuria.  Musculoskeletal: Negative for arthralgias and back pain.  Skin: Negative for color change and rash.  Neurological: Negative for seizures and syncope.  All other systems reviewed and are negative.    Physical Exam Updated Vital Signs  ED Triage Vitals  Enc Vitals Group     BP 04/15/19 1646 (!) 132/50     Pulse Rate 04/15/19 1646 74     Resp 04/15/19 1646 16     Temp 04/15/19 1646 98.1 F (36.7 C)     Temp Source 04/15/19 1646 Oral     SpO2 04/15/19 1646 100 %     Weight --      Height --      Head Circumference --      Peak Flow  --      Pain Score 04/15/19 1705 8     Pain Loc --      Pain Edu? --      Excl. in [place]? --     Physical Exam Vitals signs and nursing note reviewed.  Constitutional:      General: She is not in acute distress.    Appearance: She is well-developed. She is not ill-appearing.  HENT:     Head: Normocephalic and atraumatic.  Eyes:     Extraocular Movements: Extraocular movements intact.     Conjunctiva/sclera: Conjunctivae normal.     Pupils: Pupils are equal, round, and reactive to light.  Neck:     Musculoskeletal: Neck supple.  Cardiovascular:     Rate and Rhythm: Normal rate and regular rhythm.     Heart sounds: Normal heart sounds. No murmur.  Pulmonary:     Effort: Pulmonary effort is normal. No respiratory distress.     Breath sounds: Normal breath sounds.  Abdominal:     Palpations: Abdomen is soft.     Tenderness: There is abdominal tenderness in the epigastric area. There is no right CVA tenderness, left CVA tenderness, guarding or rebound. Negative signs include Murphy's sign, Rovsing's sign and McBurney's sign.  Skin:    General: Skin is warm and dry.     Capillary Refill: Capillary refill takes less than 2 seconds.  Neurological:     General: No focal deficit present.     Mental Status: She is alert and oriented to person, place, and time.  Psychiatric:        Mood and Affect: Mood normal.      ED Treatments / Results  Labs (all labs ordered are listed, but only abnormal results are displayed) Labs Reviewed  COMPREHENSIVE METABOLIC PANEL - Abnormal; Notable for the following components:      Result Value   BUN 5 (*)    AST 13 (*)    All other components within normal limits  URINALYSIS, ROUTINE W REFLEX MICROSCOPIC - Abnormal; Notable for the following components:   APPearance HAZY (*)    Hgb urine dipstick SMALL (*)    Ketones, ur 20 (*)    Leukocytes,Ua TRACE (*)    Bacteria, UA RARE (*)    All other components within normal limits  LIPASE, BLOOD   CBC  I-STAT BETA HCG BLOOD, ED ([facility], [facility], [facility] ONLY)    EKG None  Radiology No results found.  Procedures Procedures (including critical care time)  Medications Ordered in ED Medications  sodium chloride flush (NS) 0.9 % injection 3 mL (3 mLs Intravenous Given 04/15/19 2205)  metoCLOPramide (REGLAN) tablet 10 mg (10 mg Oral  Given 04/15/19 2205)  alum & mag hydroxide-simeth (MAALOX/MYLANTA) 200-200-20 MG/5ML suspension 30 mL (30 mLs Oral Given 04/15/19 2205)    And  lidocaine (XYLOCAINE) 2 % viscous mouth solution 15 mL (15 mLs Oral Given 04/15/19 2205)     Initial Impression / Assessment and Plan / ED Course  I have reviewed the triage vital signs and the nursing notes.  Pertinent labs & imaging results that were available during my care of the patient were reviewed by me and considered in my medical decision making (see chart for details).     Dawn Patel is a 43 year old female here with epigastric abdominal pain.  History of reflux, status post cholecystectomy.  Patient with normal vitals.  No fever.  Patient with ongoing epigastric abdominal pain that is worse with eating over the last several weeks.  Has been taking MiraLAX due to constipation with some improvement.  Patient has some nausea mostly after eating.  Denies any active nausea and vomiting now.  Has had better bowel movements.  Patient is passing gas.  She is tender in the epigastric region.  No signs of peritonitis.  No distention.  History and physical is not consistent with perforation or bowel obstruction.  Overall possibly gastritis versus some type of gastroparesis.  Patient had lab work that showed no significant anemia, electrolyte abnormality, kidney injury.  Urinalysis negative for infection.  Pregnancy test is negative.  Gallbladder and liver enzymes within normal limits.  Lipase normal.  Doubt pancreatitis.  Overall suspect likely chronic GI issue.  Will treat with Reglan, Protonix.  Patient does have follow-up  with GI on Monday.  Told her return to the ED if she develops any worsening symptoms.  No concern for obstruction at this time.  Understands return precautions and discharged in ED in good condition.  This chart was dictated using voice recognition software.  Despite best efforts to proofread,  errors can occur which can change the documentation meaning.    Final Clinical Impressions(s) / ED Diagnoses   Final diagnoses:  Epigastric pain    ED Discharge Orders         Ordered    metoCLOPramide (REGLAN) 10 MG tablet  2 times daily PRN     04/15/19 2337    pantoprazole (PROTONIX) 20 MG tablet  Daily     04/15/19 2337           Lennice Sites, DO 04/16/19 0112

## 2019-04-15 NOTE — ED Triage Notes (Signed)
Pt reports RUQ pain for the past month, pt seen last month and was told she was constipated but she has had regular BMs and is still having pain. Pt also reports nausea, no vomiting.

## 2019-04-18 DIAGNOSIS — K5900 Constipation, unspecified: Secondary | ICD-10-CM | POA: Diagnosis not present

## 2019-04-18 DIAGNOSIS — R1011 Right upper quadrant pain: Secondary | ICD-10-CM | POA: Diagnosis not present

## 2019-04-18 DIAGNOSIS — H5440 Blindness, one eye, unspecified eye: Secondary | ICD-10-CM | POA: Diagnosis not present

## 2019-04-21 DIAGNOSIS — R10811 Right upper quadrant abdominal tenderness: Secondary | ICD-10-CM | POA: Diagnosis not present

## 2019-04-21 DIAGNOSIS — R1011 Right upper quadrant pain: Secondary | ICD-10-CM | POA: Diagnosis not present

## 2019-05-03 DIAGNOSIS — R1011 Right upper quadrant pain: Secondary | ICD-10-CM | POA: Diagnosis not present

## 2019-07-13 DIAGNOSIS — Z23 Encounter for immunization: Secondary | ICD-10-CM | POA: Diagnosis not present

## 2019-09-06 DIAGNOSIS — Z20828 Contact with and (suspected) exposure to other viral communicable diseases: Secondary | ICD-10-CM | POA: Diagnosis not present

## 2019-09-08 DIAGNOSIS — Z7251 High risk heterosexual behavior: Secondary | ICD-10-CM | POA: Diagnosis not present

## 2019-09-08 DIAGNOSIS — N926 Irregular menstruation, unspecified: Secondary | ICD-10-CM | POA: Diagnosis not present

## 2019-09-12 DIAGNOSIS — N910 Primary amenorrhea: Secondary | ICD-10-CM | POA: Diagnosis not present

## 2019-09-12 DIAGNOSIS — Z7251 High risk heterosexual behavior: Secondary | ICD-10-CM | POA: Diagnosis not present

## 2019-09-12 DIAGNOSIS — Z309 Encounter for contraceptive management, unspecified: Secondary | ICD-10-CM | POA: Diagnosis not present

## 2019-11-15 ENCOUNTER — Other Ambulatory Visit: Payer: Self-pay | Admitting: Family Medicine

## 2019-11-15 DIAGNOSIS — Z1231 Encounter for screening mammogram for malignant neoplasm of breast: Secondary | ICD-10-CM

## 2019-11-22 DIAGNOSIS — Z20828 Contact with and (suspected) exposure to other viral communicable diseases: Secondary | ICD-10-CM | POA: Diagnosis not present

## 2019-12-09 DIAGNOSIS — Z20828 Contact with and (suspected) exposure to other viral communicable diseases: Secondary | ICD-10-CM | POA: Diagnosis not present

## 2019-12-12 DIAGNOSIS — H04123 Dry eye syndrome of bilateral lacrimal glands: Secondary | ICD-10-CM | POA: Diagnosis not present

## 2019-12-12 DIAGNOSIS — H5010 Unspecified exotropia: Secondary | ICD-10-CM | POA: Diagnosis not present

## 2019-12-12 DIAGNOSIS — E70328 Other oculocutaneous albinism: Secondary | ICD-10-CM | POA: Diagnosis not present

## 2019-12-12 DIAGNOSIS — H2513 Age-related nuclear cataract, bilateral: Secondary | ICD-10-CM | POA: Diagnosis not present

## 2019-12-12 DIAGNOSIS — H5501 Congenital nystagmus: Secondary | ICD-10-CM | POA: Diagnosis not present

## 2019-12-15 DIAGNOSIS — H524 Presbyopia: Secondary | ICD-10-CM | POA: Diagnosis not present

## 2019-12-15 DIAGNOSIS — H5203 Hypermetropia, bilateral: Secondary | ICD-10-CM | POA: Diagnosis not present

## 2019-12-15 DIAGNOSIS — H52209 Unspecified astigmatism, unspecified eye: Secondary | ICD-10-CM | POA: Diagnosis not present

## 2019-12-16 DIAGNOSIS — Z20828 Contact with and (suspected) exposure to other viral communicable diseases: Secondary | ICD-10-CM | POA: Diagnosis not present

## 2019-12-19 ENCOUNTER — Other Ambulatory Visit: Payer: Self-pay | Admitting: Physician Assistant

## 2019-12-19 ENCOUNTER — Other Ambulatory Visit (HOSPITAL_COMMUNITY)
Admission: RE | Admit: 2019-12-19 | Discharge: 2019-12-19 | Disposition: A | Discharge status: Home / Self Care | Payer: Medicare HMO | Source: Ambulatory Visit | Attending: Physician Assistant | Admitting: Physician Assistant

## 2019-12-19 DIAGNOSIS — Z124 Encounter for screening for malignant neoplasm of cervix: Secondary | ICD-10-CM | POA: Insufficient documentation

## 2019-12-19 DIAGNOSIS — Z0000 Encounter for general adult medical examination without abnormal findings: Secondary | ICD-10-CM | POA: Diagnosis not present

## 2019-12-19 DIAGNOSIS — K219 Gastro-esophageal reflux disease without esophagitis: Secondary | ICD-10-CM | POA: Diagnosis not present

## 2019-12-19 DIAGNOSIS — Z23 Encounter for immunization: Secondary | ICD-10-CM | POA: Diagnosis not present

## 2019-12-19 DIAGNOSIS — E7800 Pure hypercholesterolemia, unspecified: Secondary | ICD-10-CM | POA: Diagnosis not present

## 2019-12-19 DIAGNOSIS — Z131 Encounter for screening for diabetes mellitus: Secondary | ICD-10-CM | POA: Diagnosis not present

## 2019-12-20 LAB — CYTOLOGY - PAP: Diagnosis: NEGATIVE

## 2019-12-26 ENCOUNTER — Ambulatory Visit
Admission: RE | Admit: 2019-12-26 | Discharge: 2019-12-26 | Disposition: A | Discharge status: Home / Self Care | Payer: Medicare HMO | Source: Ambulatory Visit | Attending: Family Medicine | Admitting: Family Medicine

## 2019-12-26 ENCOUNTER — Other Ambulatory Visit: Payer: Self-pay

## 2019-12-26 DIAGNOSIS — Z1231 Encounter for screening mammogram for malignant neoplasm of breast: Secondary | ICD-10-CM | POA: Diagnosis not present

## 2019-12-30 DIAGNOSIS — Z20828 Contact with and (suspected) exposure to other viral communicable diseases: Secondary | ICD-10-CM | POA: Diagnosis not present

## 2020-01-20 DIAGNOSIS — Z20828 Contact with and (suspected) exposure to other viral communicable diseases: Secondary | ICD-10-CM | POA: Diagnosis not present

## 2020-02-10 DIAGNOSIS — Z20828 Contact with and (suspected) exposure to other viral communicable diseases: Secondary | ICD-10-CM | POA: Diagnosis not present

## 2020-02-17 DIAGNOSIS — Z20828 Contact with and (suspected) exposure to other viral communicable diseases: Secondary | ICD-10-CM | POA: Diagnosis not present

## 2020-02-24 DIAGNOSIS — Z20828 Contact with and (suspected) exposure to other viral communicable diseases: Secondary | ICD-10-CM | POA: Diagnosis not present

## 2020-03-02 DIAGNOSIS — Z20828 Contact with and (suspected) exposure to other viral communicable diseases: Secondary | ICD-10-CM | POA: Diagnosis not present

## 2020-03-16 DIAGNOSIS — Z20828 Contact with and (suspected) exposure to other viral communicable diseases: Secondary | ICD-10-CM | POA: Diagnosis not present

## 2020-03-23 DIAGNOSIS — Z20828 Contact with and (suspected) exposure to other viral communicable diseases: Secondary | ICD-10-CM | POA: Diagnosis not present

## 2020-03-30 DIAGNOSIS — Z20828 Contact with and (suspected) exposure to other viral communicable diseases: Secondary | ICD-10-CM | POA: Diagnosis not present

## 2020-04-20 DIAGNOSIS — Z20828 Contact with and (suspected) exposure to other viral communicable diseases: Secondary | ICD-10-CM | POA: Diagnosis not present

## 2020-05-11 DIAGNOSIS — Z20828 Contact with and (suspected) exposure to other viral communicable diseases: Secondary | ICD-10-CM | POA: Diagnosis not present

## 2020-05-11 DIAGNOSIS — R05 Cough: Secondary | ICD-10-CM | POA: Diagnosis not present

## 2020-05-12 DIAGNOSIS — Z712 Person consulting for explanation of examination or test findings: Secondary | ICD-10-CM | POA: Diagnosis not present

## 2020-05-12 DIAGNOSIS — R05 Cough: Secondary | ICD-10-CM | POA: Diagnosis not present

## 2020-05-12 DIAGNOSIS — Z20828 Contact with and (suspected) exposure to other viral communicable diseases: Secondary | ICD-10-CM | POA: Diagnosis not present

## 2020-05-18 DIAGNOSIS — Z20828 Contact with and (suspected) exposure to other viral communicable diseases: Secondary | ICD-10-CM | POA: Diagnosis not present

## 2020-05-18 DIAGNOSIS — R05 Cough: Secondary | ICD-10-CM | POA: Diagnosis not present

## 2020-05-19 DIAGNOSIS — Z712 Person consulting for explanation of examination or test findings: Secondary | ICD-10-CM | POA: Diagnosis not present

## 2020-05-19 DIAGNOSIS — R05 Cough: Secondary | ICD-10-CM | POA: Diagnosis not present

## 2020-05-19 DIAGNOSIS — Z20828 Contact with and (suspected) exposure to other viral communicable diseases: Secondary | ICD-10-CM | POA: Diagnosis not present

## 2020-05-25 DIAGNOSIS — Z20828 Contact with and (suspected) exposure to other viral communicable diseases: Secondary | ICD-10-CM | POA: Diagnosis not present

## 2020-05-25 DIAGNOSIS — R05 Cough: Secondary | ICD-10-CM | POA: Diagnosis not present

## 2020-05-26 DIAGNOSIS — R05 Cough: Secondary | ICD-10-CM | POA: Diagnosis not present

## 2020-05-26 DIAGNOSIS — Z20828 Contact with and (suspected) exposure to other viral communicable diseases: Secondary | ICD-10-CM | POA: Diagnosis not present

## 2020-06-01 DIAGNOSIS — Z20828 Contact with and (suspected) exposure to other viral communicable diseases: Secondary | ICD-10-CM | POA: Diagnosis not present

## 2020-06-01 DIAGNOSIS — R05 Cough: Secondary | ICD-10-CM | POA: Diagnosis not present

## 2020-06-02 DIAGNOSIS — R05 Cough: Secondary | ICD-10-CM | POA: Diagnosis not present

## 2020-06-02 DIAGNOSIS — Z20828 Contact with and (suspected) exposure to other viral communicable diseases: Secondary | ICD-10-CM | POA: Diagnosis not present

## 2020-06-04 DIAGNOSIS — R05 Cough: Secondary | ICD-10-CM | POA: Diagnosis not present

## 2020-06-04 DIAGNOSIS — Z20828 Contact with and (suspected) exposure to other viral communicable diseases: Secondary | ICD-10-CM | POA: Diagnosis not present

## 2020-06-06 DIAGNOSIS — Z20828 Contact with and (suspected) exposure to other viral communicable diseases: Secondary | ICD-10-CM | POA: Diagnosis not present

## 2020-06-06 DIAGNOSIS — R05 Cough: Secondary | ICD-10-CM | POA: Diagnosis not present

## 2020-06-15 DIAGNOSIS — Z20828 Contact with and (suspected) exposure to other viral communicable diseases: Secondary | ICD-10-CM | POA: Diagnosis not present

## 2020-06-15 DIAGNOSIS — R05 Cough: Secondary | ICD-10-CM | POA: Diagnosis not present

## 2020-06-16 DIAGNOSIS — Z20828 Contact with and (suspected) exposure to other viral communicable diseases: Secondary | ICD-10-CM | POA: Diagnosis not present

## 2020-06-16 DIAGNOSIS — R05 Cough: Secondary | ICD-10-CM | POA: Diagnosis not present

## 2020-06-19 DIAGNOSIS — K219 Gastro-esophageal reflux disease without esophagitis: Secondary | ICD-10-CM | POA: Diagnosis not present

## 2020-06-22 DIAGNOSIS — Z20828 Contact with and (suspected) exposure to other viral communicable diseases: Secondary | ICD-10-CM | POA: Diagnosis not present

## 2020-06-23 DIAGNOSIS — Z20828 Contact with and (suspected) exposure to other viral communicable diseases: Secondary | ICD-10-CM | POA: Diagnosis not present

## 2020-07-05 DIAGNOSIS — Z20828 Contact with and (suspected) exposure to other viral communicable diseases: Secondary | ICD-10-CM | POA: Diagnosis not present

## 2020-07-06 DIAGNOSIS — Z20828 Contact with and (suspected) exposure to other viral communicable diseases: Secondary | ICD-10-CM | POA: Diagnosis not present

## 2020-07-13 DIAGNOSIS — Z20828 Contact with and (suspected) exposure to other viral communicable diseases: Secondary | ICD-10-CM | POA: Diagnosis not present

## 2020-07-14 DIAGNOSIS — Z20828 Contact with and (suspected) exposure to other viral communicable diseases: Secondary | ICD-10-CM | POA: Diagnosis not present

## 2020-07-20 DIAGNOSIS — Z20828 Contact with and (suspected) exposure to other viral communicable diseases: Secondary | ICD-10-CM | POA: Diagnosis not present

## 2020-07-21 DIAGNOSIS — Z20828 Contact with and (suspected) exposure to other viral communicable diseases: Secondary | ICD-10-CM | POA: Diagnosis not present

## 2020-07-30 DIAGNOSIS — Z658 Other specified problems related to psychosocial circumstances: Secondary | ICD-10-CM | POA: Diagnosis not present

## 2020-07-30 DIAGNOSIS — K219 Gastro-esophageal reflux disease without esophagitis: Secondary | ICD-10-CM | POA: Diagnosis not present

## 2020-08-03 DIAGNOSIS — Z20828 Contact with and (suspected) exposure to other viral communicable diseases: Secondary | ICD-10-CM | POA: Diagnosis not present

## 2020-08-03 DIAGNOSIS — R059 Cough, unspecified: Secondary | ICD-10-CM | POA: Diagnosis not present

## 2020-08-04 DIAGNOSIS — Z20828 Contact with and (suspected) exposure to other viral communicable diseases: Secondary | ICD-10-CM | POA: Diagnosis not present

## 2020-08-04 DIAGNOSIS — R059 Cough, unspecified: Secondary | ICD-10-CM | POA: Diagnosis not present

## 2020-08-20 DIAGNOSIS — F411 Generalized anxiety disorder: Secondary | ICD-10-CM | POA: Diagnosis not present

## 2020-08-20 DIAGNOSIS — K219 Gastro-esophageal reflux disease without esophagitis: Secondary | ICD-10-CM | POA: Diagnosis not present

## 2020-08-20 DIAGNOSIS — Z23 Encounter for immunization: Secondary | ICD-10-CM | POA: Diagnosis not present

## 2020-08-24 DIAGNOSIS — R059 Cough, unspecified: Secondary | ICD-10-CM | POA: Diagnosis not present

## 2020-08-24 DIAGNOSIS — Z20828 Contact with and (suspected) exposure to other viral communicable diseases: Secondary | ICD-10-CM | POA: Diagnosis not present

## 2020-08-25 ENCOUNTER — Emergency Department (HOSPITAL_COMMUNITY): Payer: Medicare HMO

## 2020-08-25 ENCOUNTER — Other Ambulatory Visit: Payer: Self-pay

## 2020-08-25 ENCOUNTER — Encounter (HOSPITAL_COMMUNITY): Payer: Self-pay | Admitting: Emergency Medicine

## 2020-08-25 ENCOUNTER — Emergency Department (HOSPITAL_COMMUNITY)
Admission: EM | Admit: 2020-08-25 | Discharge: 2020-08-25 | Disposition: A | Discharge status: Home / Self Care | Payer: Medicare HMO | Attending: Emergency Medicine | Admitting: Emergency Medicine

## 2020-08-25 DIAGNOSIS — G8929 Other chronic pain: Secondary | ICD-10-CM | POA: Insufficient documentation

## 2020-08-25 DIAGNOSIS — Z20828 Contact with and (suspected) exposure to other viral communicable diseases: Secondary | ICD-10-CM | POA: Diagnosis not present

## 2020-08-25 DIAGNOSIS — R1011 Right upper quadrant pain: Secondary | ICD-10-CM | POA: Diagnosis not present

## 2020-08-25 DIAGNOSIS — Z9049 Acquired absence of other specified parts of digestive tract: Secondary | ICD-10-CM | POA: Diagnosis not present

## 2020-08-25 DIAGNOSIS — Z79891 Long term (current) use of opiate analgesic: Secondary | ICD-10-CM | POA: Diagnosis not present

## 2020-08-25 DIAGNOSIS — Z96659 Presence of unspecified artificial knee joint: Secondary | ICD-10-CM | POA: Diagnosis not present

## 2020-08-25 DIAGNOSIS — R059 Cough, unspecified: Secondary | ICD-10-CM | POA: Diagnosis not present

## 2020-08-25 DIAGNOSIS — R109 Unspecified abdominal pain: Secondary | ICD-10-CM | POA: Diagnosis not present

## 2020-08-25 HISTORY — DX: Albinism, unspecified: E70.30

## 2020-08-25 LAB — COMPREHENSIVE METABOLIC PANEL
ALT: 11 U/L (ref 0–44)
AST: 13 U/L — ABNORMAL LOW (ref 15–41)
Albumin: 3.8 g/dL (ref 3.5–5.0)
Alkaline Phosphatase: 44 U/L (ref 38–126)
Anion gap: 9 (ref 5–15)
BUN: 6 mg/dL (ref 6–20)
CO2: 26 mmol/L (ref 22–32)
Calcium: 9.1 mg/dL (ref 8.9–10.3)
Chloride: 105 mmol/L (ref 98–111)
Creatinine, Ser: 0.81 mg/dL (ref 0.44–1.00)
GFR, Estimated: >60 mL/min (ref >60)
Glucose, Bld: 92 mg/dL (ref 70–99)
Potassium: 3.6 mmol/L (ref 3.5–5.1)
Sodium: 140 mmol/L (ref 135–145)
Total Bilirubin: 0.6 mg/dL (ref 0.3–1.2)
Total Protein: 6 g/dL — ABNORMAL LOW (ref 6.5–8.1)

## 2020-08-25 LAB — CBC WITH DIFFERENTIAL/PLATELET
Abs Immature Granulocytes: 0 10*3/uL (ref 0.00–0.07)
Basophils Absolute: 0 10*3/uL (ref 0.0–0.1)
Basophils Relative: 1 %
Eosinophils Absolute: 0 10*3/uL (ref 0.0–0.5)
Eosinophils Relative: 1 %
HCT: 40.5 % (ref 36.0–46.0)
Hemoglobin: 13.4 g/dL (ref 12.0–15.0)
Immature Granulocytes: 0 %
Lymphocytes Relative: 36 %
Lymphs Abs: 1.4 10*3/uL (ref 0.7–4.0)
MCH: 29.4 pg (ref 26.0–34.0)
MCHC: 33.1 g/dL (ref 30.0–36.0)
MCV: 88.8 fL (ref 80.0–100.0)
Monocytes Absolute: 0.3 10*3/uL (ref 0.1–1.0)
Monocytes Relative: 7 %
Neutro Abs: 2.2 10*3/uL (ref 1.7–7.7)
Neutrophils Relative %: 55 %
Platelets: 222 10*3/uL (ref 150–400)
RBC: 4.56 MIL/uL (ref 3.87–5.11)
RDW: 11.9 % (ref 11.5–15.5)
WBC: 3.9 10*3/uL — ABNORMAL LOW (ref 4.0–10.5)
nRBC: 0 % (ref 0.0–0.2)

## 2020-08-25 LAB — URINALYSIS, ROUTINE W REFLEX MICROSCOPIC
Bilirubin Urine: NEGATIVE
Glucose, UA: NEGATIVE mg/dL
Hgb urine dipstick: NEGATIVE
Ketones, ur: NEGATIVE mg/dL
Leukocytes,Ua: NEGATIVE
Nitrite: NEGATIVE
Protein, ur: NEGATIVE mg/dL
Specific Gravity, Urine: 1.002 — ABNORMAL LOW (ref 1.005–1.030)
pH: 7 (ref 5.0–8.0)

## 2020-08-25 LAB — LIPASE, BLOOD: Lipase: 22 U/L (ref 11–51)

## 2020-08-25 LAB — HCG, QUANTITATIVE, PREGNANCY: hCG, Beta Chain, Quant, S: <1 m[IU]/mL (ref <5)

## 2020-08-25 MED ORDER — IOHEXOL 300 MG/ML  SOLN
100.0000 mL | Freq: Once | INTRAMUSCULAR | Status: AC | PRN
  Administered 2020-08-25: 100 mL via INTRAVENOUS

## 2020-08-25 MED ORDER — ONDANSETRON HCL 4 MG/2ML IJ SOLN: 4.0000 mg | Freq: Once | INTRAMUSCULAR | Status: DC

## 2020-08-25 NOTE — ED Provider Notes (Signed)
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  She has had chronic right upper quadrant pain for the past 2 years status post cholecystectomy.  This pain worsened over the past several days that she now rates it as a 6 out of 10, aching in nature.  She recently was switched from pantoprazole to famotidine as there was concern that the pantoprazole was causing her to have no appetite.  The pain is somewhat worse with palpation.  It has been associated with dizziness, nausea and weight loss.   Past Medical History:  Diagnosis Date  . 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 Cholelithiases   . Fracture of wrist    left  . Fractured shoulder   . Fractured toe   . GERD (gastroesophageal reflux disease)   . Legally blind   . Pneumonia   . Wears glasses     Past Surgical History:  Procedure Laterality Date  . CHOLECYSTECTOMY N/A 05/11/2015   Procedure: LAPAROSCOPIC CHOLECYSTECTOMY WITH INTRAOPERATIVE CHOLANGIOGRAM;  Surgeon: Armandina Gemma, MD;  Location: [facility];  Service: General;  Laterality: N/A;  . KNEE ARTHROSCOPY      Family History  Problem Relation Age of Onset  . Diabetes Mother   . Hypertension Mother   . Hypertension Father   . Cirrhosis Father   . Diabetes Brother     Social History   Tobacco Use  . Smoking status: Never Smoker  . Smokeless tobacco: Never Used  Substance Use Topics  . Alcohol use: No  . Drug use: No    Prior to Admission medications   Medication Sig Start Date End Date Taking? Authorizing Provider  dicyclomine (BENTYL) 20 MG tablet Take 1 tablet (20 mg total) by mouth 2 (two) times daily. 02/20/19   Carlisle Cater, PA-C  metoCLOPramide (REGLAN) 10 MG tablet Take 1 tablet (10 mg total) by mouth 2 (two) times daily as needed for  up to 10 doses for nausea. 04/15/19   Curatolo, Adam, DO  omeprazole (PRILOSEC) 20 MG capsule Take 1 capsule (20 mg total) by mouth daily. 02/20/19   Carlisle Cater, PA-C  pantoprazole (PROTONIX) 20 MG tablet Take 1 tablet (20 mg total) by mouth daily. 04/15/19 05/15/19  Curatolo, Adam, DO  polyethylene glycol powder (GLYCOLAX/MIRALAX) 17 GM/SCOOP powder Take 17 g by mouth daily. 02/20/19   Carlisle Cater, PA-C    Allergies Hydrocodone-acetaminophen and Oxycodone-acetaminophen   REVIEW OF SYSTEMS  Negative except as noted here or in the History of Present Illness.   PHYSICAL EXAMINATION  Initial Vital Signs Blood pressure 131/81, pulse 60, temperature 98.4 F (36.9 C), temperature source Oral, resp. rate 16, height 5\' 7"  (1.702 m), weight 93 kg, SpO2 98 %.  Examination General: Well-developed, well-nourished female in no acute distress; appearance consistent with age of record HENT: normocephalic; atraumatic Eyes: pupils equal, round and reactive to light; strabismus; intermittent nystagmus Neck: supple Heart: regular rate and rhythm Lungs: clear to auscultation bilaterally Abdomen: soft; nondistended; mild right upper quadrant tenderness; bowel sounds present Extremities: No deformity; full range of motion; pulses normal Neurologic: Awake, alert and oriented; motor function intact in all extremities and symmetric; no facial droop Skin: Warm and dry Psychiatric: Normal mood and affect   RESULTS  Summary of this visit's results,  reviewed and interpreted by myself:   EKG Interpretation  Date/Time:    Ventricular Rate:    PR Interval:    QRS Duration:   QT Interval:    QTC Calculation:   R Axis:     Text Interpretation:        Laboratory Studies: Results for orders placed or performed during the hospital encounter of 08/25/20 (from the past 24 hour(s))  Lipase, blood     Status: None   Collection Time: 08/25/20  5:39 AM  Result Value Ref Range   Lipase 22 11 - 51 U/L    Comprehensive metabolic panel     Status: Abnormal   Collection Time: 08/25/20  5:39 AM  Result Value Ref Range   Sodium 140 135 - 145 mmol/L   Potassium 3.6 3.5 - 5.1 mmol/L   Chloride 105 98 - 111 mmol/L   CO2 26 22 - 32 mmol/L   Glucose, Bld 92 70 - 99 mg/dL   BUN 6 6 - 20 mg/dL   Creatinine, Ser 0.81 0.44 - 1.00 mg/dL   Calcium 9.1 8.9 - 10.3 mg/dL   Total Protein 6.0 (L) 6.5 - 8.1 g/dL   Albumin 3.8 3.5 - 5.0 g/dL   AST 13 (L) 15 - 41 U/L   ALT 11 0 - 44 U/L   Alkaline Phosphatase 44 38 - 126 U/L   Total Bilirubin 0.6 0.3 - 1.2 mg/dL   GFR, Estimated >60 >60 mL/min   Anion gap 9 5 - 15  Urinalysis, Routine w reflex microscopic Urine, Clean Catch     Status: Abnormal   Collection Time: 08/25/20  5:39 AM  Result Value Ref Range   Color, Urine COLORLESS (A) YELLOW   APPearance CLEAR CLEAR   Specific Gravity, Urine 1.002 (L) 1.005 - 1.030   pH 7.0 5.0 - 8.0   Glucose, UA NEGATIVE NEGATIVE mg/dL   Hgb urine dipstick NEGATIVE NEGATIVE   Bilirubin Urine NEGATIVE NEGATIVE   Ketones, ur NEGATIVE NEGATIVE mg/dL   Protein, ur NEGATIVE NEGATIVE mg/dL   Nitrite NEGATIVE NEGATIVE   Leukocytes,Ua NEGATIVE NEGATIVE  CBC with Differential/Platelet     Status: Abnormal   Collection Time: 08/25/20  5:41 AM  Result Value Ref Range   WBC 3.9 (L) 4.0 - 10.5 K/uL   RBC 4.56 3.87 - 5.11 MIL/uL   Hemoglobin 13.4 12.0 - 15.0 g/dL   HCT 40.5 36 - 46 %   MCV 88.8 80.0 - 100.0 fL   MCH 29.4 26.0 - 34.0 pg   MCHC 33.1 30.0 - 36.0 g/dL   RDW 11.9 11.5 - 15.5 %   Platelets 222 150 - 400 K/uL   nRBC 0.0 0.0 - 0.2 %   Neutrophils Relative % 55 %   Neutro Abs 2.2 1.7 - 7.7 K/uL   Lymphocytes Relative 36 %   Lymphs Abs 1.4 0.7 - 4.0 K/uL   Monocytes Relative 7 %   Monocytes Absolute 0.3 0.1 - 1.0 K/uL   Eosinophils Relative 1 %   Eosinophils Absolute 0.0 0.0 - 0.5 K/uL   Basophils Relative 1 %   Basophils Absolute 0.0 0.0 - 0.1 K/uL   Immature Granulocytes 0 %   Abs Immature Granulocytes  0.00 0.00 - 0.07 K/uL  hCG, quantitative, pregnancy     Status: None   Collection Time: 08/25/20  5:49 AM  Result Value Ref Range   hCG, Beta Chain, Quant, S <1 <5 mIU/mL   Imaging Studies: CT ABDOMEN PELVIS W CONTRAST  Result Date: 08/25/2020 CLINICAL DATA:  Right lower quadrant abdominal pain. EXAM: CT ABDOMEN AND PELVIS WITH CONTRAST TECHNIQUE: Multidetector CT imaging of the abdomen and pelvis was performed using the standard protocol following bolus administration of intravenous contrast. CONTRAST:  159mL OMNIPAQUE IOHEXOL 300 MG/ML  SOLN COMPARISON:  None. FINDINGS: Lower chest: The lung bases are clear of acute process. No pleural effusion or pulmonary lesions. The heart is normal in size. No pericardial effusion. The distal esophagus and aorta are unremarkable. Hepatobiliary: No hepatic lesions or intrahepatic biliary dilatation. The gallbladder is surgically absent. No common bile duct dilatation. Pancreas: No mass, inflammation or ductal dilatation. Spleen: Normal size. No focal lesions. Adrenals/Urinary Tract: The adrenal glands and kidneys are unremarkable. No renal, ureteral or bladder calculi or mass. No CT findings suspicious for pyelonephritis. The delayed images do not demonstrate any significant collecting system abnormalities. The bladder is unremarkable. Stomach/Bowel: The stomach, duodenum, small bowel and colon are unremarkable. No acute inflammatory changes, mass lesions or obstructive findings. The terminal ileum is normal. The appendix is normal. Vascular/Lymphatic: The aorta is normal in caliber. No dissection. The branch vessels are patent. The major venous structures are patent. No mesenteric or retroperitoneal mass or adenopathy. Small scattered lymph nodes are noted. Reproductive: The uterus and ovaries are unremarkable. Small uterine fibroids are noted incidentally. Other: No pelvic mass or adenopathy. No free pelvic fluid collections. No inguinal mass or adenopathy. No  abdominal wall hernia or subcutaneous lesions. Musculoskeletal: No significant bony findings. IMPRESSION: 1. No acute abdominal/pelvic findings, mass lesions or adenopathy. 2. Status post cholecystectomy. No biliary dilatation. 3. Small uterine fibroids. Electronically Signed   By: Marijo Sanes M.D.   On: 08/25/2020 08:37    ED COURSE and MDM  Nursing notes, initial and subsequent vitals signs, including pulse oximetry, reviewed and interpreted by myself.  Vitals:   08/25/20 0700 08/25/20 0705 08/25/20 0715 08/25/20 0911  BP: 111/68  118/76 114/74  Pulse: (!) 59 (!) 57 (!) 58 90  Resp:   14 15  Temp:      TempSrc:      SpO2: 100% 100% 100% 100%  Weight:      Height:       Medications  iohexol (OMNIPAQUE) 300 MG/ML solution 100 mL (100 mLs Intravenous Contrast Given 08/25/20 0720)   7:05 AM Signed out to Dr. Tyrone Nine.  CT abdomen and pelvis pending.   PROCEDURES  Procedures   ED DIAGNOSES     ICD-10-CM   1. Chronic right upper quadrant pain  R10.11    G89.29        Dawn Patel, Dawn Reichmann, MD 08/25/20 2240

## 2020-08-25 NOTE — ED Triage Notes (Signed)
Patient here from home reporting abd pain and hypertension. States that she woke up this morning "feeling ill", states EMS came to her home and said that BP was elevated.

## 2020-08-25 NOTE — ED Provider Notes (Signed)
Received the patient in signout from Dr. Florina Ou, briefly the patient is a 44 year old female with chronic right upper quadrant abdominal pain.  Plan for CT scan of the abdomen pelvis.  If negative discharge.  CT negative for acute pathology.  Lab work without LFT elevation lipase is normal.  Discharge home.   Deno Etienne, DO 08/25/20 [phone]

## 2020-08-25 NOTE — ED Notes (Signed)
Patient here with multiple c/o , right sided abdominal pain, indigestion, decreased appetite, intermittent constipation, and intermittent nausea, all over the past few months.

## 2020-09-14 DIAGNOSIS — R059 Cough, unspecified: Secondary | ICD-10-CM | POA: Diagnosis not present

## 2020-09-14 DIAGNOSIS — Z20828 Contact with and (suspected) exposure to other viral communicable diseases: Secondary | ICD-10-CM | POA: Diagnosis not present

## 2020-09-15 DIAGNOSIS — R059 Cough, unspecified: Secondary | ICD-10-CM | POA: Diagnosis not present

## 2020-09-15 DIAGNOSIS — Z20828 Contact with and (suspected) exposure to other viral communicable diseases: Secondary | ICD-10-CM | POA: Diagnosis not present

## 2020-10-02 DIAGNOSIS — Z20828 Contact with and (suspected) exposure to other viral communicable diseases: Secondary | ICD-10-CM | POA: Diagnosis not present

## 2020-10-08 DIAGNOSIS — Z20828 Contact with and (suspected) exposure to other viral communicable diseases: Secondary | ICD-10-CM | POA: Diagnosis not present

## 2020-10-09 DIAGNOSIS — Z20828 Contact with and (suspected) exposure to other viral communicable diseases: Secondary | ICD-10-CM | POA: Diagnosis not present

## 2020-10-09 DIAGNOSIS — U071 COVID-19: Secondary | ICD-10-CM | POA: Diagnosis not present

## 2020-10-15 DIAGNOSIS — D259 Leiomyoma of uterus, unspecified: Secondary | ICD-10-CM | POA: Diagnosis not present

## 2020-10-15 DIAGNOSIS — N920 Excessive and frequent menstruation with regular cycle: Secondary | ICD-10-CM | POA: Diagnosis not present

## 2020-10-15 DIAGNOSIS — K5901 Slow transit constipation: Secondary | ICD-10-CM | POA: Diagnosis not present

## 2020-11-06 DIAGNOSIS — N920 Excessive and frequent menstruation with regular cycle: Secondary | ICD-10-CM | POA: Diagnosis not present

## 2020-11-06 DIAGNOSIS — F411 Generalized anxiety disorder: Secondary | ICD-10-CM | POA: Diagnosis not present

## 2020-11-06 DIAGNOSIS — R102 Pelvic and perineal pain: Secondary | ICD-10-CM | POA: Diagnosis not present

## 2020-11-06 DIAGNOSIS — K219 Gastro-esophageal reflux disease without esophagitis: Secondary | ICD-10-CM | POA: Diagnosis not present

## 2020-11-06 DIAGNOSIS — D259 Leiomyoma of uterus, unspecified: Secondary | ICD-10-CM | POA: Diagnosis not present

## 2020-12-20 DIAGNOSIS — K5900 Constipation, unspecified: Secondary | ICD-10-CM | POA: Diagnosis not present

## 2020-12-20 DIAGNOSIS — Z0000 Encounter for general adult medical examination without abnormal findings: Secondary | ICD-10-CM | POA: Diagnosis not present

## 2020-12-20 DIAGNOSIS — K219 Gastro-esophageal reflux disease without esophagitis: Secondary | ICD-10-CM | POA: Diagnosis not present

## 2020-12-20 DIAGNOSIS — Z1211 Encounter for screening for malignant neoplasm of colon: Secondary | ICD-10-CM | POA: Diagnosis not present

## 2020-12-20 DIAGNOSIS — Z131 Encounter for screening for diabetes mellitus: Secondary | ICD-10-CM | POA: Diagnosis not present

## 2020-12-20 DIAGNOSIS — H547 Unspecified visual loss: Secondary | ICD-10-CM | POA: Diagnosis not present

## 2020-12-20 DIAGNOSIS — E7800 Pure hypercholesterolemia, unspecified: Secondary | ICD-10-CM | POA: Diagnosis not present

## 2021-04-24 DIAGNOSIS — B356 Tinea cruris: Secondary | ICD-10-CM | POA: Diagnosis not present

## 2021-04-24 DIAGNOSIS — K219 Gastro-esophageal reflux disease without esophagitis: Secondary | ICD-10-CM | POA: Diagnosis not present

## 2021-05-01 IMAGING — CT CT ABD-PELV W/ CM
3 of 5 series · 16 of 46 positions shown, 18 images · IV contrast (omnipaque)
Comparison: None.

CLINICAL DATA: Right lower quadrant abdominal pain.

EXAM:
CT ABDOMEN AND PELVIS WITH CONTRAST
TECHNIQUE: Multidetector CT imaging of the abdomen and pelvis was performed
using the standard protocol following bolus administration of
intravenous contrast.
CONTRAST:  100mL OMNIPAQUE IOHEXOL 300 MG/ML  SOLN

[Series 2: axial st · axial · 0.68mm/px · z∈[-325,+45]mm · 12 of 88 slices shown, 14 images]
[im 7/88  soft-tissue]
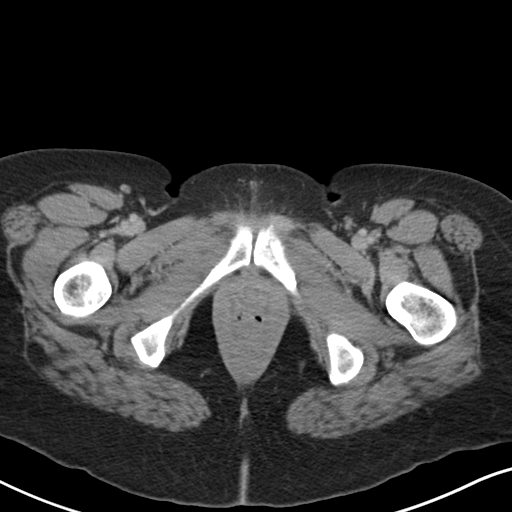
[im 7/88  bone]
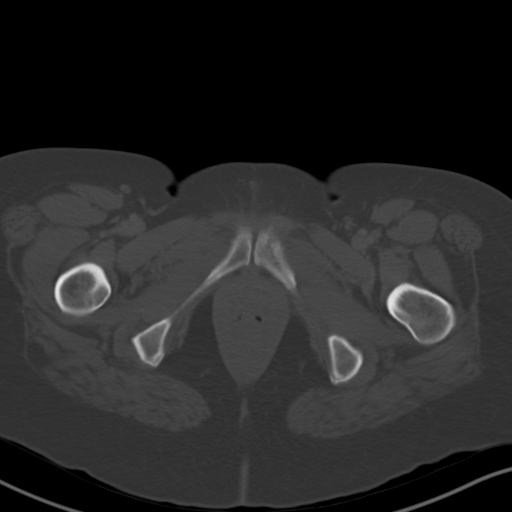
[im 14/88  soft-tissue]
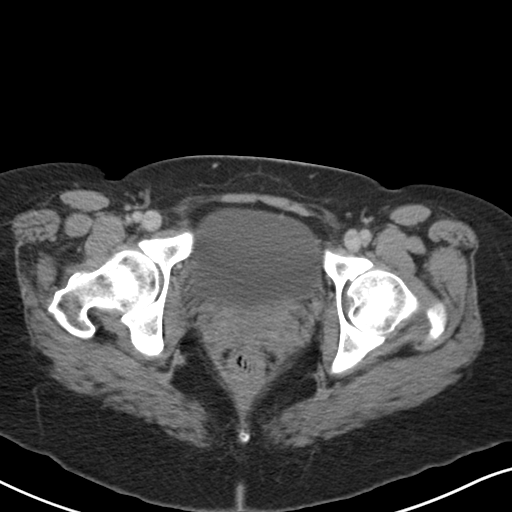
[im 21/88  soft-tissue]
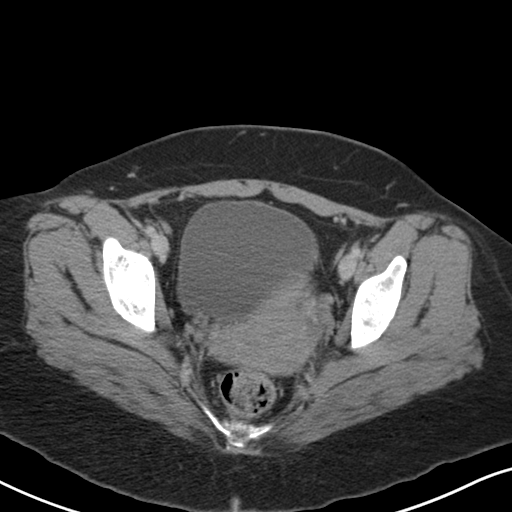
[im 27/88  soft-tissue]
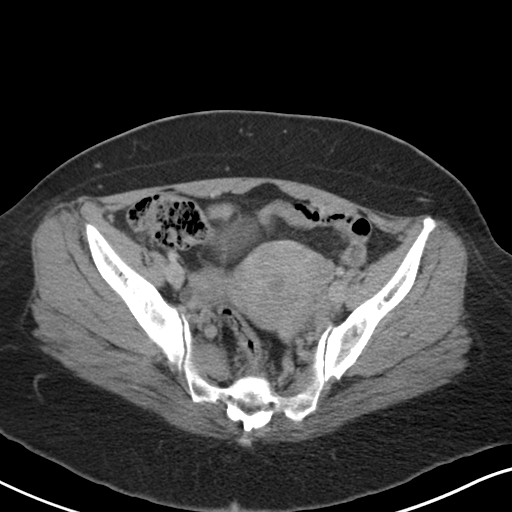
[im 34/88  soft-tissue]
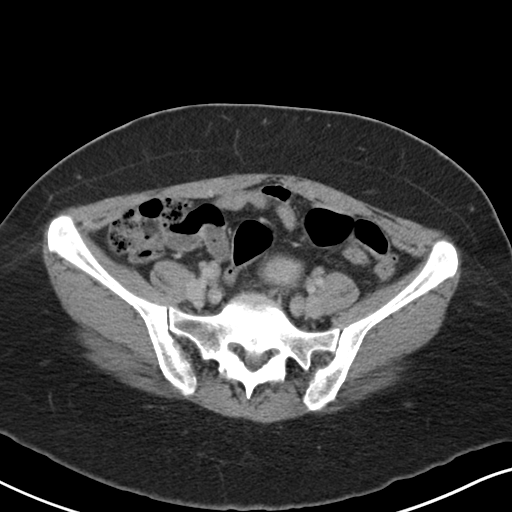
[im 41/88  soft-tissue]
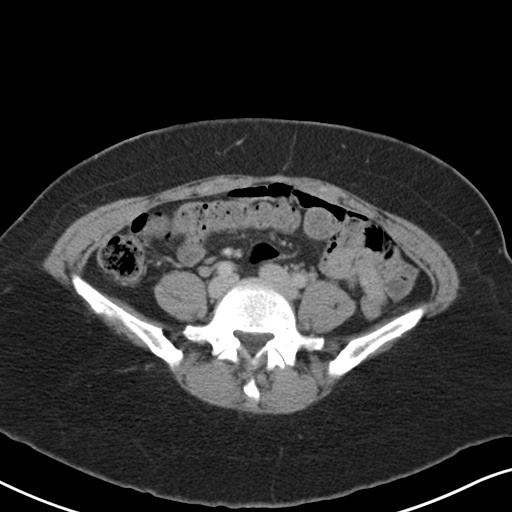
[im 47/88  soft-tissue]
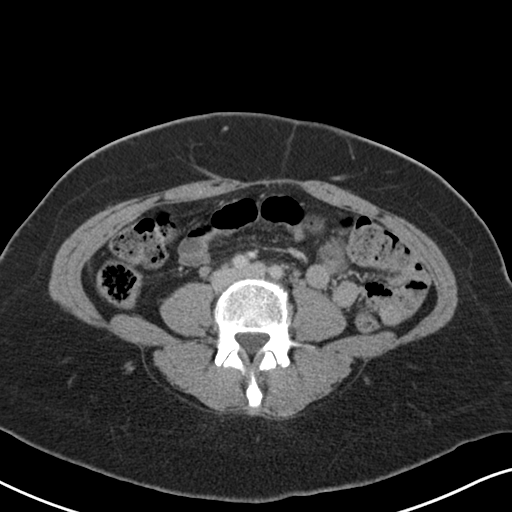
[im 54/88  soft-tissue]
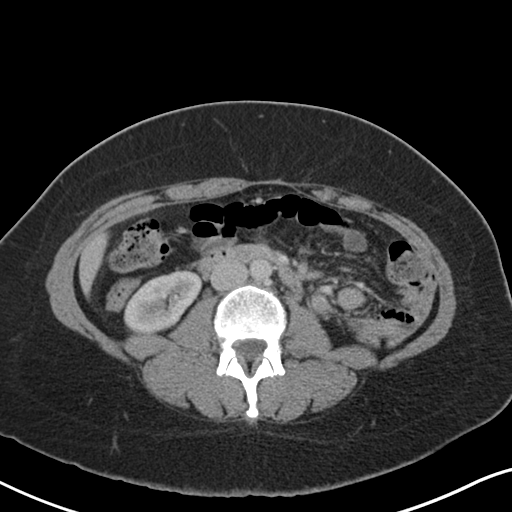
[im 61/88  soft-tissue]
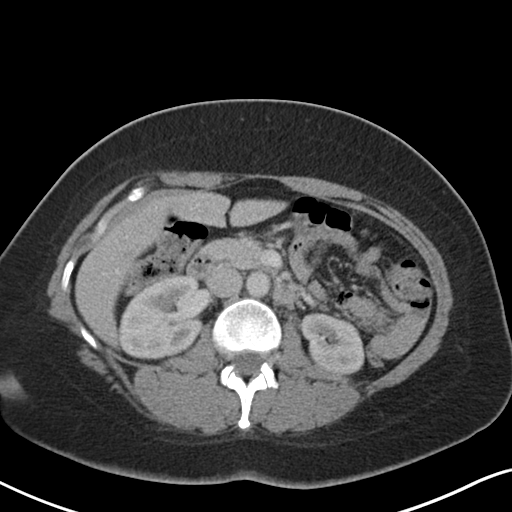
[im 61/88  bone]
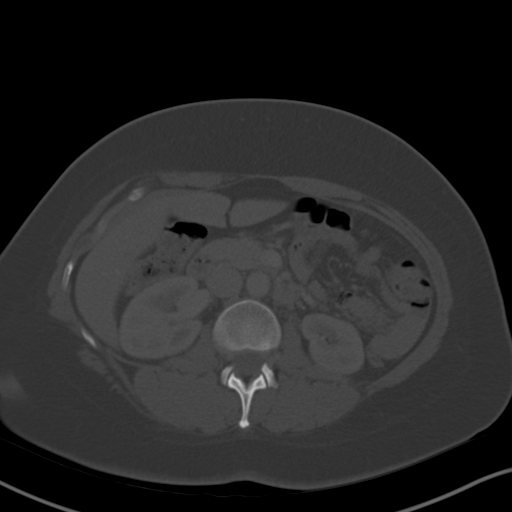
[im 67/88  soft-tissue]
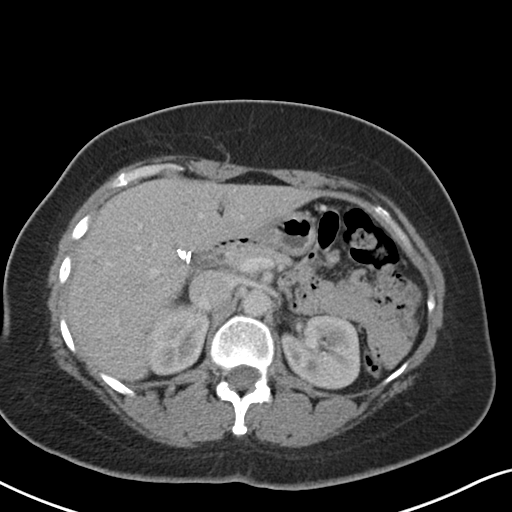
[im 74/88  soft-tissue]
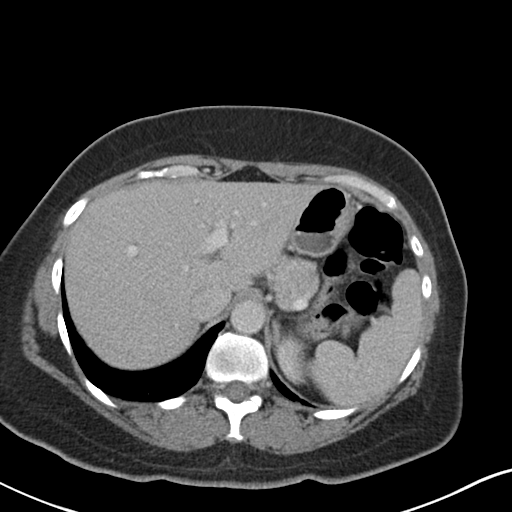
[im 81/88  soft-tissue]
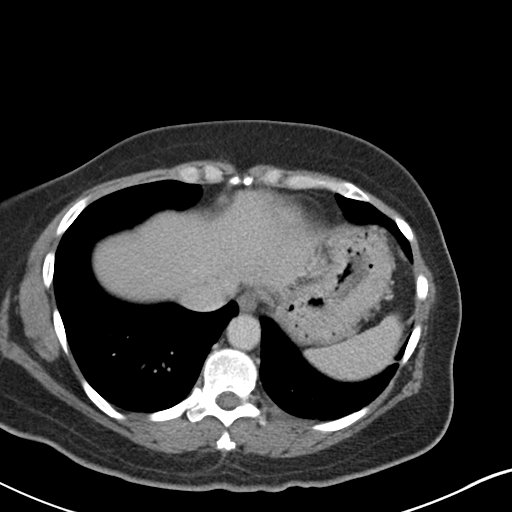

[Series 4: lung bases · axial · 0.68mm/px · 1 of 76 slices shown]
[im 7/76  bone]
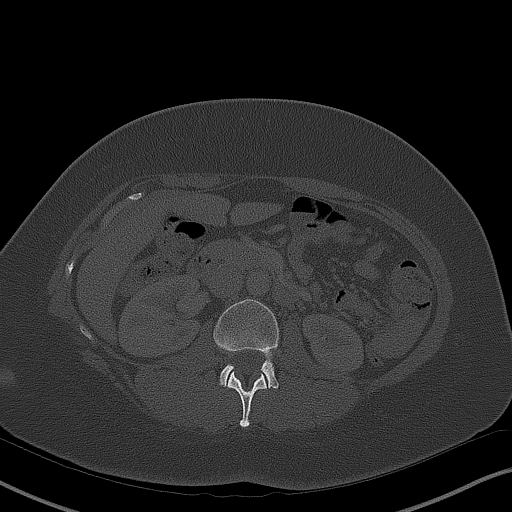

[Series 5: coronal st · coronal · 0.83mm/px · 3 of 150 slices shown]
[im 50/150  soft-tissue]
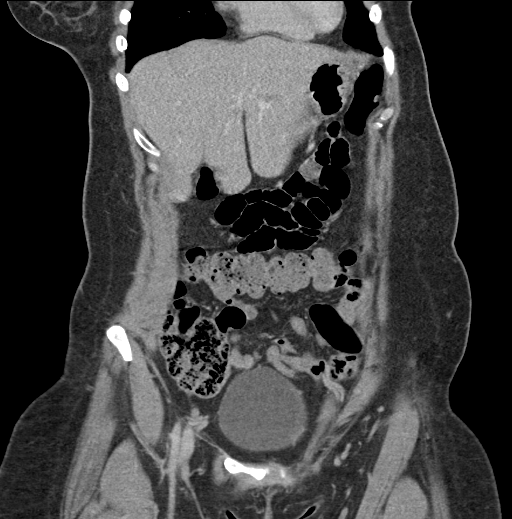
[im 67/150  soft-tissue]
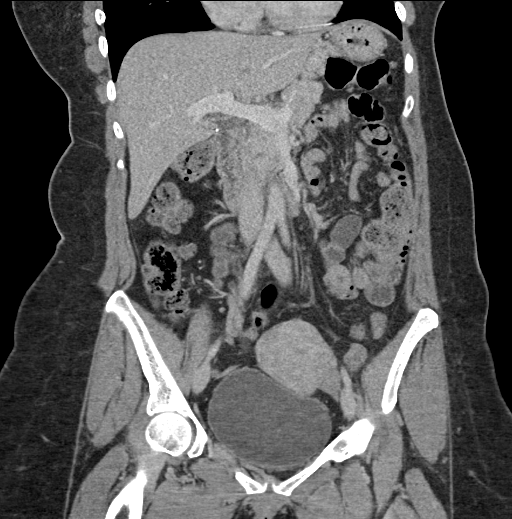
[im 83/150  soft-tissue]
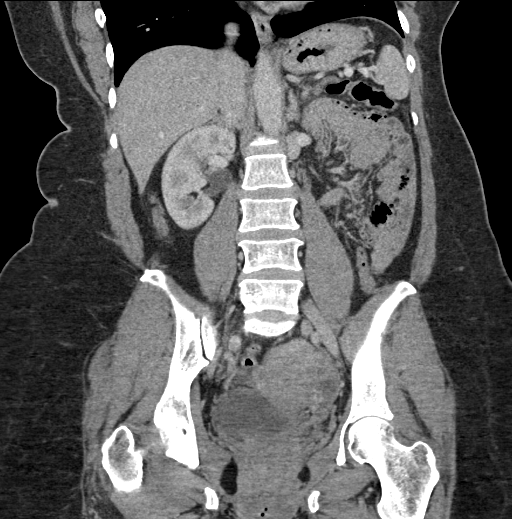

[16 of 46 positions shown; findings below may reference images not displayed]

FINDINGS: Lower chest: The lung bases are clear of acute process. No pleural
effusion or pulmonary lesions. The heart is normal in size. No
pericardial effusion. The distal esophagus and aorta are
unremarkable.

Hepatobiliary: No hepatic lesions or intrahepatic biliary
dilatation. The gallbladder is surgically absent. No common bile
duct dilatation.

Pancreas: No mass, inflammation or ductal dilatation.

Spleen: Normal size. No focal lesions.

Adrenals/Urinary Tract: The adrenal glands and kidneys are
unremarkable. No renal, ureteral or bladder calculi or mass. No CT
findings suspicious for pyelonephritis. The delayed images do not
demonstrate any significant collecting system abnormalities. The
bladder is unremarkable.

Stomach/Bowel: The stomach, duodenum, small bowel and colon are
unremarkable. No acute inflammatory changes, mass lesions or
obstructive findings. The terminal ileum is normal. The appendix is
normal.

Vascular/Lymphatic: The aorta is normal in caliber. No dissection.
The branch vessels are patent. The major venous structures are
patent. No mesenteric or retroperitoneal mass or adenopathy. Small
scattered lymph nodes are noted.

Reproductive: The uterus and ovaries are unremarkable. Small uterine
fibroids are noted incidentally.

Other: No pelvic mass or adenopathy. No free pelvic fluid
collections. No inguinal mass or adenopathy. No abdominal wall
hernia or subcutaneous lesions.

Musculoskeletal: No significant bony findings.
IMPRESSION: 1. No acute abdominal/pelvic findings, mass lesions or adenopathy.
2. Status post cholecystectomy. No biliary dilatation.
3. Small uterine fibroids.

## 2021-07-08 DIAGNOSIS — Z23 Encounter for immunization: Secondary | ICD-10-CM | POA: Diagnosis not present

## 2021-09-12 DIAGNOSIS — Z1211 Encounter for screening for malignant neoplasm of colon: Secondary | ICD-10-CM | POA: Diagnosis not present

## 2021-09-12 DIAGNOSIS — D124 Benign neoplasm of descending colon: Secondary | ICD-10-CM | POA: Diagnosis not present

## 2021-09-12 DIAGNOSIS — K648 Other hemorrhoids: Secondary | ICD-10-CM | POA: Diagnosis not present

## 2021-10-22 DIAGNOSIS — R000 Tachycardia, unspecified: Secondary | ICD-10-CM | POA: Diagnosis not present

## 2021-10-22 DIAGNOSIS — R002 Palpitations: Secondary | ICD-10-CM | POA: Diagnosis not present

## 2021-10-24 ENCOUNTER — Ambulatory Visit (INDEPENDENT_AMBULATORY_CARE_PROVIDER_SITE_OTHER): Payer: Medicare HMO

## 2021-10-24 ENCOUNTER — Encounter: Payer: Self-pay | Admitting: *Deleted

## 2021-10-24 ENCOUNTER — Other Ambulatory Visit: Payer: Self-pay | Admitting: Physician Assistant

## 2021-10-24 DIAGNOSIS — R002 Palpitations: Secondary | ICD-10-CM

## 2021-10-24 NOTE — Progress Notes (Unsigned)
Enrolled for [employer] to mail a ZIO XT long term holter monitor to the patients address on file.  Letter with instructions mailed to patient.  Dr. Gardiner Rhyme to read.

## 2021-10-28 ENCOUNTER — Ambulatory Visit: Payer: Medicare HMO | Admitting: Cardiology

## 2021-10-28 ENCOUNTER — Encounter: Payer: Self-pay | Admitting: Cardiology

## 2021-10-28 ENCOUNTER — Other Ambulatory Visit: Payer: Self-pay

## 2021-10-28 VITALS — BP 118/66 | HR 55 | Ht 67.0 in | Wt 194.8 lb

## 2021-10-28 DIAGNOSIS — R002 Palpitations: Secondary | ICD-10-CM

## 2021-10-28 NOTE — Patient Instructions (Signed)
Medication Instructions:  No Changes In Medications at this time.  *If you need a refill on your cardiac medications before your next appointment, please call your pharmacy*  Testing/Procedures: Your physician has requested that you have an echocardiogram. Echocardiography is a painless test that uses sound waves to create images of your heart. It provides your doctor with information about the size and shape of your heart and how well your hearts chambers and valves are working. You may receive an ultrasound enhancing agent through an IV if needed to better visualize your heart during the echo.This procedure takes approximately one hour. There are no restrictions for this procedure. This will take place at the [address], [address].   Follow-Up: At [facility], you and your health needs are our priority.  As part of our continuing mission to provide you with exceptional heart care, we have created designated Provider Care Teams.  These Care Teams include your primary Cardiologist (physician) and Advanced Practice Providers (APPs -  Physician Assistants and Nurse Practitioners) who all work together to provide you with the care you need, when you need it.  We recommend signing up for the patient portal called "MyChart".  Sign up information is provided on this After Visit Summary.  MyChart is used to connect with patients for Virtual Visits (Telemedicine).  Patients are able to view lab/test results, encounter notes, upcoming appointments, etc.  Non-urgent messages can be sent to your provider as well.   To learn more about what you can do with MyChart, go to [email or web address].    Your next appointment:   6 month(s)  The format for your next appointment:   In Person  Provider:   Donato Heinz, MD

## 2021-10-28 NOTE — Progress Notes (Signed)
Cardiology Office Note:    Date:  10/28/2021   ID:  Dawn Patel, DOB 01/02/1976, MRN [id number]  PCP:  Mayra Neer, MD  Cardiologist:  Donato Heinz, MD  Electrophysiologist:  None   Referring MD: Lennie Odor, [place]   Chief Complaint  Patient presents with   Palpitations    History of Present Illness:    Dawn Patel is a 46 y.o. female with a hx of GERD who is referred by Lennie Odor, PA for evaluation of palpitations.  She reports that palpitations started a couple weeks ago, reports intermittent palpitations where feels like heart is racing.  Can last up to 5 to 10 minutes.  Has been occurring a few times per week, though no episodes over the last 5 days.  She denies any lightheadedness, syncope, chest pain, dyspnea, or lower extremity edema.  She has not been exercising.  She drinks 1 cup of coffee per day, may also have a caffeinated soda.  Has not noted any relationship between palpitations and caffeine.  Denies any smoking or alcohol use.  No known history of heart disease in her immediate family.   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   pantoprazole (PROTONIX) 40 MG tablet Take 40 mg by mouth daily.   polyethylene glycol powder (GLYCOLAX/MIRALAX) 17 GM/SCOOP powder Take 17 g by mouth daily.     Allergies:   Hydrocodone-acetaminophen and Oxycodone-acetaminophen   Social History    Socioeconomic History   Marital status: Single    Spouse name: Not on file   Number of children: Not on file   Years of education: Not on file   Highest education level: Not on file  Occupational History   Not on file  Tobacco Use   Smoking status: Never   Smokeless tobacco: Never  Substance and Sexual Activity   Alcohol use: No   Drug use: No   Sexual activity: Not on file  Other Topics Concern   Not on file  Social History Narrative   Not on file   Social Determinants of Health   Financial Resource Strain: Not on file  Food Insecurity: Not on file  Transportation Needs: Not on file  Physical Activity: Not on file  Stress: Not on file  Social Connections: Not on file     Family History: The patient's family history includes Cirrhosis in her father; Diabetes in her brother and mother; Hypertension in her father and mother.  ROS:   Please see the history of present illness.     All other systems reviewed and are negative.  EKGs/Labs/Other Studies Reviewed:    The following studies were reviewed today:   EKG:   10/28/20: Sinus bradycardia, rate 55, no ST abnormalities  Recent Labs: No results found for requested labs within last 8760 hours.  Recent Lipid Panel No results found for:  CHOL, TRIG, HDL, CHOLHDL, VLDL, LDLCALC, LDLDIRECT  Physical Exam:    VS:  BP 118/66    Pulse (!) 55    Ht 5\' 7"  (1.702 m)    Wt 194 lb 12.8 oz (88.4 kg)    SpO2 95%    BMI 30.51 kg/m     Wt Readings from Last 3 Encounters:  10/28/21 194 lb 12.8 oz (88.4 kg)  08/25/20 205 lb (93 kg)  02/20/19 240 lb (108.9 kg)     GEN:  Well nourished, well developed in no acute distress HEENT: Normal NECK: No JVD; No carotid bruits LYMPHATICS: No lymphadenopathy CARDIAC: RRR, no murmurs, rubs, gallops RESPIRATORY:  Clear to auscultation without rales, wheezing or rhonchi  ABDOMEN: Soft, non-tender, non-distended MUSCULOSKELETAL:  No edema; No deformity  SKIN: Warm and dry NEUROLOGIC:   Alert and oriented x 3 PSYCHIATRIC:  Normal affect   ASSESSMENT:    1. Palpitations    PLAN:    Palpitations: Description concerning for arrhythmia.  TSH normal on 10/22/2021.  Zio patch x2 weeks for further evaluation.  Check echocardiogram to evaluate for structural heart disease   RTC in 6 months  Medication Adjustments/Labs and Tests Ordered: Current medicines are reviewed at length with the patient today.  Concerns regarding medicines are outlined above.  Orders Placed This Encounter  Procedures   EKG 12-Lead   ECHOCARDIOGRAM COMPLETE   No orders of the defined types were placed in this encounter.   Patient Instructions  Medication Instructions:  No Changes In Medications at this time.  *If you need a refill on your cardiac medications before your next appointment, please call your pharmacy*  Testing/Procedures: Your physician has requested that you have an echocardiogram. Echocardiography is a painless test that uses sound waves to create images of your heart. It provides your doctor with information about the size and shape of your heart and how well your hearts chambers and valves are working. You may receive an ultrasound enhancing agent through an IV if needed to better visualize your heart during the echo.This procedure takes approximately one hour. There are no restrictions for this procedure. This will take place at the [address], [address].   Follow-Up: At [facility], you and your health needs are our priority.  As part of our continuing mission to provide you with exceptional heart care, we have created designated Provider Care Teams.  These Care Teams include your primary Cardiologist (physician) and Advanced Practice Providers (APPs -  Physician Assistants and Nurse Practitioners) who all work together to provide you with the care you need, when you need it.  We recommend signing up for the patient portal called "MyChart".  Sign up information is  provided on this After Visit Summary.  MyChart is used to connect with patients for Virtual Visits (Telemedicine).  Patients are able to view lab/test results, encounter notes, upcoming appointments, etc.  Non-urgent messages can be sent to your provider as well.   To learn more about what you can do with MyChart, go to [email or web address].    Your next appointment:   6 month(s)  The format for your next appointment:   In Person  Provider:   Donato Heinz, MD     Signed, Donato Heinz, MD  10/28/2021 10:36 AM    [place]

## 2021-10-29 DIAGNOSIS — R002 Palpitations: Secondary | ICD-10-CM | POA: Diagnosis not present

## 2021-11-11 ENCOUNTER — Other Ambulatory Visit (HOSPITAL_COMMUNITY): Payer: Medicare HMO

## 2021-11-19 DIAGNOSIS — R002 Palpitations: Secondary | ICD-10-CM | POA: Diagnosis not present

## 2021-11-20 DIAGNOSIS — J208 Acute bronchitis due to other specified organisms: Secondary | ICD-10-CM | POA: Diagnosis not present

## 2021-11-25 DIAGNOSIS — N920 Excessive and frequent menstruation with regular cycle: Secondary | ICD-10-CM | POA: Diagnosis not present

## 2021-11-25 DIAGNOSIS — D259 Leiomyoma of uterus, unspecified: Secondary | ICD-10-CM | POA: Diagnosis not present

## 2021-11-28 ENCOUNTER — Ambulatory Visit (HOSPITAL_COMMUNITY): Payer: Medicare HMO

## 2021-12-23 DIAGNOSIS — F411 Generalized anxiety disorder: Secondary | ICD-10-CM | POA: Diagnosis not present

## 2021-12-23 DIAGNOSIS — Z0000 Encounter for general adult medical examination without abnormal findings: Secondary | ICD-10-CM | POA: Diagnosis not present

## 2021-12-23 DIAGNOSIS — H543 Unqualified visual loss, both eyes: Secondary | ICD-10-CM | POA: Diagnosis not present

## 2021-12-23 DIAGNOSIS — E7800 Pure hypercholesterolemia, unspecified: Secondary | ICD-10-CM | POA: Diagnosis not present

## 2021-12-23 DIAGNOSIS — Z131 Encounter for screening for diabetes mellitus: Secondary | ICD-10-CM | POA: Diagnosis not present

## 2021-12-23 DIAGNOSIS — K219 Gastro-esophageal reflux disease without esophagitis: Secondary | ICD-10-CM | POA: Diagnosis not present

## 2021-12-23 DIAGNOSIS — K5900 Constipation, unspecified: Secondary | ICD-10-CM | POA: Diagnosis not present

## 2021-12-26 ENCOUNTER — Ambulatory Visit (HOSPITAL_COMMUNITY): Payer: Medicare HMO | Attending: Cardiovascular Disease

## 2021-12-26 ENCOUNTER — Other Ambulatory Visit: Payer: Self-pay

## 2021-12-26 DIAGNOSIS — R002 Palpitations: Secondary | ICD-10-CM | POA: Diagnosis not present

## 2021-12-26 LAB — ECHOCARDIOGRAM COMPLETE
Area-P 1/2: 3.91 cm2
S' Lateral: 3.3 cm

## 2022-02-17 DIAGNOSIS — S90861A Insect bite (nonvenomous), right foot, initial encounter: Secondary | ICD-10-CM | POA: Diagnosis not present

## 2022-02-17 DIAGNOSIS — W57XXXA Bitten or stung by nonvenomous insect and other nonvenomous arthropods, initial encounter: Secondary | ICD-10-CM | POA: Diagnosis not present

## 2022-02-17 DIAGNOSIS — Z309 Encounter for contraceptive management, unspecified: Secondary | ICD-10-CM | POA: Diagnosis not present

## 2022-02-21 DIAGNOSIS — S90861A Insect bite (nonvenomous), right foot, initial encounter: Secondary | ICD-10-CM | POA: Diagnosis not present

## 2022-02-21 DIAGNOSIS — S90862A Insect bite (nonvenomous), left foot, initial encounter: Secondary | ICD-10-CM | POA: Diagnosis not present

## 2022-02-21 DIAGNOSIS — M79671 Pain in right foot: Secondary | ICD-10-CM | POA: Diagnosis not present

## 2022-02-21 DIAGNOSIS — W57XXXA Bitten or stung by nonvenomous insect and other nonvenomous arthropods, initial encounter: Secondary | ICD-10-CM | POA: Diagnosis not present

## 2022-06-13 DIAGNOSIS — L03311 Cellulitis of abdominal wall: Secondary | ICD-10-CM | POA: Diagnosis not present

## 2022-06-13 DIAGNOSIS — W57XXXA Bitten or stung by nonvenomous insect and other nonvenomous arthropods, initial encounter: Secondary | ICD-10-CM | POA: Diagnosis not present

## 2022-06-13 DIAGNOSIS — S30861A Insect bite (nonvenomous) of abdominal wall, initial encounter: Secondary | ICD-10-CM | POA: Diagnosis not present

## 2022-06-16 DIAGNOSIS — L03311 Cellulitis of abdominal wall: Secondary | ICD-10-CM | POA: Diagnosis not present

## 2022-06-16 DIAGNOSIS — S30861D Insect bite (nonvenomous) of abdominal wall, subsequent encounter: Secondary | ICD-10-CM | POA: Diagnosis not present

## 2022-06-16 DIAGNOSIS — W57XXXA Bitten or stung by nonvenomous insect and other nonvenomous arthropods, initial encounter: Secondary | ICD-10-CM | POA: Diagnosis not present

## 2022-06-24 DIAGNOSIS — S30861D Insect bite (nonvenomous) of abdominal wall, subsequent encounter: Secondary | ICD-10-CM | POA: Diagnosis not present

## 2022-06-24 DIAGNOSIS — T63301D Toxic effect of unspecified spider venom, accidental (unintentional), subsequent encounter: Secondary | ICD-10-CM | POA: Diagnosis not present

## 2022-07-14 ENCOUNTER — Other Ambulatory Visit: Payer: Self-pay | Admitting: Family Medicine

## 2022-07-14 DIAGNOSIS — Z1231 Encounter for screening mammogram for malignant neoplasm of breast: Secondary | ICD-10-CM

## 2022-07-29 ENCOUNTER — Encounter (HOSPITAL_COMMUNITY): Payer: Self-pay

## 2022-07-29 ENCOUNTER — Emergency Department (HOSPITAL_COMMUNITY)
Admission: EM | Admit: 2022-07-29 | Discharge: 2022-07-29 | Discharge status: Against Medical Advice | Payer: Medicare HMO | Attending: Emergency Medicine | Admitting: Emergency Medicine

## 2022-07-29 DIAGNOSIS — R109 Unspecified abdominal pain: Secondary | ICD-10-CM | POA: Insufficient documentation

## 2022-07-29 DIAGNOSIS — R42 Dizziness and giddiness: Secondary | ICD-10-CM | POA: Diagnosis not present

## 2022-07-29 DIAGNOSIS — R110 Nausea: Secondary | ICD-10-CM | POA: Insufficient documentation

## 2022-07-29 DIAGNOSIS — Z5321 Procedure and treatment not carried out due to patient leaving prior to being seen by health care provider: Secondary | ICD-10-CM | POA: Diagnosis not present

## 2022-07-29 DIAGNOSIS — R5381 Other malaise: Secondary | ICD-10-CM | POA: Diagnosis not present

## 2022-07-29 DIAGNOSIS — R5383 Other fatigue: Secondary | ICD-10-CM | POA: Insufficient documentation

## 2022-07-29 DIAGNOSIS — I959 Hypotension, unspecified: Secondary | ICD-10-CM | POA: Diagnosis not present

## 2022-07-29 LAB — CBC
HCT: 40.3 % (ref 36.0–46.0)
Hemoglobin: 13 g/dL (ref 12.0–15.0)
MCH: 29.1 pg (ref 26.0–34.0)
MCHC: 32.3 g/dL (ref 30.0–36.0)
MCV: 90.2 fL (ref 80.0–100.0)
Platelets: 219 10*3/uL (ref 150–400)
RBC: 4.47 MIL/uL (ref 3.87–5.11)
RDW: 12.4 % (ref 11.5–15.5)
WBC: 7.8 10*3/uL (ref 4.0–10.5)
nRBC: 0 % (ref 0.0–0.2)

## 2022-07-29 LAB — BASIC METABOLIC PANEL
Anion gap: 9 (ref 5–15)
BUN: 9 mg/dL (ref 6–20)
CO2: 19 mmol/L — ABNORMAL LOW (ref 22–32)
Calcium: 7.6 mg/dL — ABNORMAL LOW (ref 8.9–10.3)
Chloride: 110 mmol/L (ref 98–111)
Creatinine, Ser: 0.87 mg/dL (ref 0.44–1.00)
GFR, Estimated: >60 mL/min (ref >60)
Glucose, Bld: 103 mg/dL — ABNORMAL HIGH (ref 70–99)
Potassium: 3.6 mmol/L (ref 3.5–5.1)
Sodium: 138 mmol/L (ref 135–145)

## 2022-07-29 LAB — I-STAT BETA HCG BLOOD, ED (MC, WL, AP ONLY): I-stat hCG, quantitative: <5 m[IU]/mL (ref <5)

## 2022-07-29 NOTE — ED Provider Triage Note (Signed)
Emergency Medicine Provider Triage Evaluation Note  Dawn Patel , a 46 y.o. female  was evaluated in triage.  Pt complains of generalized malaise, fatigue, dizziness.  Patient reports this morning she woke up "not feeling well" described as having slight abdominal discomfort as well as nausea.  Patient reports that this persisted throughout the morning causing her to call out of work.  The patient reports that she went to get out of bed this morning and she was extremely dizzy upon standing.  Patient denies losing consciousness.  Patient reports that this dizziness persisted causing her to call 911.  Patient denies any excessive dizziness upon my interview.  Patient is able to ambulate around the room without difficulty.  Patient has normal strength and sensation upper and lower extremities bilaterally.  The patient has no facial droop, slurred speech.  Patient denies any fevers, vomiting, diarrhea.  The patient denies any recent sore throat, cough.  Review of Systems  Positive:  Negative:   Physical Exam  BP (!) 109/57   Pulse 73   Temp 98.6 F (37 C) (Oral)   Resp 18   SpO2 99%  Gen:   Awake, no distress   Resp:  Normal effort  MSK:   Moves extremities without difficulty  Other:    Medical Decision Making  Medically screening exam initiated at 12:16 PM.  Appropriate orders placed.  [name] was informed that the remainder of the evaluation will be completed by another provider, this initial triage assessment does not replace that evaluation, and the importance of remaining in the ED until their evaluation is complete.     [name], PA-C 07/29/22 1218

## 2022-07-29 NOTE — ED Notes (Signed)
Pt stated she was leaving and walked out of the door.

## 2022-07-29 NOTE — ED Triage Notes (Signed)
Pt arrived via EMS, from home, woke at 10am this morning, stood and felt extremely dizzy. BP sitting up,  and nausea 82N systolic for EMS.   500cc NS 4 mg zofran  16 G L AC

## 2022-08-15 DIAGNOSIS — R42 Dizziness and giddiness: Secondary | ICD-10-CM | POA: Diagnosis not present

## 2022-08-15 DIAGNOSIS — I959 Hypotension, unspecified: Secondary | ICD-10-CM | POA: Diagnosis not present

## 2022-08-18 ENCOUNTER — Ambulatory Visit
Admission: RE | Admit: 2022-08-18 | Discharge: 2022-08-18 | Disposition: A | Discharge status: Home / Self Care | Payer: Medicare HMO | Source: Ambulatory Visit | Attending: Family Medicine | Admitting: Family Medicine

## 2022-08-18 DIAGNOSIS — Z1231 Encounter for screening mammogram for malignant neoplasm of breast: Secondary | ICD-10-CM | POA: Diagnosis not present

## 2022-12-01 ENCOUNTER — Other Ambulatory Visit: Payer: Self-pay | Admitting: Obstetrics and Gynecology

## 2022-12-01 ENCOUNTER — Other Ambulatory Visit (HOSPITAL_COMMUNITY)
Admission: RE | Admit: 2022-12-01 | Discharge: 2022-12-01 | Disposition: A | Discharge status: Home / Self Care | Payer: Medicare HMO | Source: Ambulatory Visit | Attending: Obstetrics and Gynecology | Admitting: Obstetrics and Gynecology

## 2022-12-01 DIAGNOSIS — D259 Leiomyoma of uterus, unspecified: Secondary | ICD-10-CM | POA: Diagnosis not present

## 2022-12-01 DIAGNOSIS — Z01419 Encounter for gynecological examination (general) (routine) without abnormal findings: Secondary | ICD-10-CM | POA: Insufficient documentation

## 2022-12-01 DIAGNOSIS — Z1151 Encounter for screening for human papillomavirus (HPV): Secondary | ICD-10-CM | POA: Insufficient documentation

## 2022-12-03 LAB — CYTOLOGY - PAP
Comment: NEGATIVE
Diagnosis: NEGATIVE
High risk HPV: NEGATIVE

## 2023-02-12 DIAGNOSIS — E7030 Albinism, unspecified: Secondary | ICD-10-CM | POA: Diagnosis not present

## 2023-02-12 DIAGNOSIS — H547 Unspecified visual loss: Secondary | ICD-10-CM | POA: Diagnosis not present

## 2023-02-12 DIAGNOSIS — R7303 Prediabetes: Secondary | ICD-10-CM | POA: Diagnosis not present

## 2023-02-12 DIAGNOSIS — K219 Gastro-esophageal reflux disease without esophagitis: Secondary | ICD-10-CM | POA: Diagnosis not present

## 2023-02-12 DIAGNOSIS — Z0000 Encounter for general adult medical examination without abnormal findings: Secondary | ICD-10-CM | POA: Diagnosis not present

## 2023-02-12 DIAGNOSIS — K5900 Constipation, unspecified: Secondary | ICD-10-CM | POA: Diagnosis not present

## 2023-02-12 DIAGNOSIS — F411 Generalized anxiety disorder: Secondary | ICD-10-CM | POA: Diagnosis not present

## 2023-02-12 DIAGNOSIS — J309 Allergic rhinitis, unspecified: Secondary | ICD-10-CM | POA: Diagnosis not present

## 2023-02-12 DIAGNOSIS — E7800 Pure hypercholesterolemia, unspecified: Secondary | ICD-10-CM | POA: Diagnosis not present

## 2023-02-24 DIAGNOSIS — E70321 Tyrosinase positive oculocutaneous albinism: Secondary | ICD-10-CM | POA: Diagnosis not present

## 2023-02-24 DIAGNOSIS — D225 Melanocytic nevi of trunk: Secondary | ICD-10-CM | POA: Diagnosis not present

## 2023-02-24 DIAGNOSIS — L821 Other seborrheic keratosis: Secondary | ICD-10-CM | POA: Diagnosis not present

## 2023-02-24 DIAGNOSIS — L570 Actinic keratosis: Secondary | ICD-10-CM | POA: Diagnosis not present

## 2023-02-24 DIAGNOSIS — L814 Other melanin hyperpigmentation: Secondary | ICD-10-CM | POA: Diagnosis not present

## 2023-02-25 DIAGNOSIS — N898 Other specified noninflammatory disorders of vagina: Secondary | ICD-10-CM | POA: Diagnosis not present

## 2023-02-25 DIAGNOSIS — Z0000 Encounter for general adult medical examination without abnormal findings: Secondary | ICD-10-CM | POA: Diagnosis not present

## 2023-06-25 DIAGNOSIS — H5010 Unspecified exotropia: Secondary | ICD-10-CM | POA: Diagnosis not present

## 2023-06-25 DIAGNOSIS — H04123 Dry eye syndrome of bilateral lacrimal glands: Secondary | ICD-10-CM | POA: Diagnosis not present

## 2023-06-25 DIAGNOSIS — H5501 Congenital nystagmus: Secondary | ICD-10-CM | POA: Diagnosis not present

## 2023-06-25 DIAGNOSIS — E70328 Other oculocutaneous albinism: Secondary | ICD-10-CM | POA: Diagnosis not present

## 2023-06-25 DIAGNOSIS — H2513 Age-related nuclear cataract, bilateral: Secondary | ICD-10-CM | POA: Diagnosis not present

## 2023-06-29 DIAGNOSIS — H524 Presbyopia: Secondary | ICD-10-CM | POA: Diagnosis not present

## 2023-07-01 DIAGNOSIS — T63484A Toxic effect of venom of other arthropod, undetermined, initial encounter: Secondary | ICD-10-CM | POA: Diagnosis not present

## 2024-08-15 DIAGNOSIS — N926 Irregular menstruation, unspecified: Secondary | ICD-10-CM | POA: Diagnosis not present

## 2024-08-15 DIAGNOSIS — M79671 Pain in right foot: Secondary | ICD-10-CM | POA: Diagnosis not present
